# Patient Record
Sex: Female | Born: 1960 | Race: White | Hispanic: No | Marital: Married | Smoking: Never smoker
Health system: Southern US, Community
[De-identification: ages and names within clinical notes are randomized; demographics above are authoritative.]

## PROBLEM LIST (undated history)

## (undated) DIAGNOSIS — Z8739 Personal history of other diseases of the musculoskeletal system and connective tissue: Secondary | ICD-10-CM

## (undated) DIAGNOSIS — R238 Other skin changes: Secondary | ICD-10-CM

## (undated) DIAGNOSIS — N952 Postmenopausal atrophic vaginitis: Secondary | ICD-10-CM

## (undated) DIAGNOSIS — I8390 Asymptomatic varicose veins of unspecified lower extremity: Secondary | ICD-10-CM

## (undated) DIAGNOSIS — E039 Hypothyroidism, unspecified: Secondary | ICD-10-CM

## (undated) DIAGNOSIS — M1990 Unspecified osteoarthritis, unspecified site: Secondary | ICD-10-CM

## (undated) DIAGNOSIS — I10 Essential (primary) hypertension: Secondary | ICD-10-CM

## (undated) DIAGNOSIS — E049 Nontoxic goiter, unspecified: Secondary | ICD-10-CM

## (undated) DIAGNOSIS — N3001 Acute cystitis with hematuria: Secondary | ICD-10-CM

## (undated) DIAGNOSIS — F988 Other specified behavioral and emotional disorders with onset usually occurring in childhood and adolescence: Secondary | ICD-10-CM

## (undated) DIAGNOSIS — R350 Frequency of micturition: Secondary | ICD-10-CM

## (undated) DIAGNOSIS — M7989 Other specified soft tissue disorders: Secondary | ICD-10-CM

## (undated) DIAGNOSIS — K5190 Ulcerative colitis, unspecified, without complications: Secondary | ICD-10-CM

## (undated) DIAGNOSIS — Z01419 Encounter for gynecological examination (general) (routine) without abnormal findings: Secondary | ICD-10-CM

## (undated) HISTORY — DX: Other skin changes: R23.8

## (undated) HISTORY — DX: Encounter for gynecological examination (general) (routine) without abnormal findings: Z01.419

## (undated) HISTORY — DX: Other specified soft tissue disorders: M79.89

## (undated) HISTORY — DX: Hypothyroidism, unspecified: E03.9

## (undated) HISTORY — DX: Other specified behavioral and emotional disorders with onset usually occurring in childhood and adolescence: F98.8

## (undated) HISTORY — DX: Acute cystitis with hematuria: N30.01

## (undated) HISTORY — DX: Personal history of other diseases of the musculoskeletal system and connective tissue: Z87.39

## (undated) HISTORY — DX: Unspecified osteoarthritis, unspecified site: M19.90

## (undated) HISTORY — DX: Ulcerative colitis, unspecified, without complications: K51.90

## (undated) HISTORY — DX: Frequency of micturition: R35.0

## (undated) HISTORY — DX: Essential (primary) hypertension: I10

## (undated) HISTORY — DX: Nontoxic goiter, unspecified: E04.9

## (undated) HISTORY — DX: Asymptomatic varicose veins of unspecified lower extremity: I83.90

## (undated) HISTORY — DX: Postmenopausal atrophic vaginitis: N95.2

---

## 1998-06-10 ENCOUNTER — Other Ambulatory Visit: Admission: RE | Admit: 1998-06-10 | Discharge: 1998-06-10 | Payer: Self-pay | Admitting: Obstetrics and Gynecology

## 1998-12-09 ENCOUNTER — Inpatient Hospital Stay (HOSPITAL_COMMUNITY): Admission: AD | Admit: 1998-12-09 | Discharge: 1998-12-09 | Payer: Self-pay | Admitting: *Deleted

## 1998-12-15 ENCOUNTER — Other Ambulatory Visit: Admission: RE | Admit: 1998-12-15 | Discharge: 1998-12-15 | Payer: Self-pay | Admitting: Obstetrics and Gynecology

## 1999-02-10 ENCOUNTER — Encounter: Payer: Self-pay | Admitting: Obstetrics and Gynecology

## 1999-02-10 ENCOUNTER — Ambulatory Visit (HOSPITAL_COMMUNITY): Admission: RE | Admit: 1999-02-10 | Discharge: 1999-02-10 | Payer: Self-pay | Admitting: Obstetrics and Gynecology

## 1999-02-14 ENCOUNTER — Inpatient Hospital Stay (HOSPITAL_COMMUNITY): Admission: AD | Admit: 1999-02-14 | Discharge: 1999-02-14 | Payer: Self-pay | Admitting: Obstetrics and Gynecology

## 1999-06-03 ENCOUNTER — Inpatient Hospital Stay (HOSPITAL_COMMUNITY): Admission: AD | Admit: 1999-06-03 | Discharge: 1999-06-03 | Payer: Self-pay | Admitting: Obstetrics and Gynecology

## 1999-07-07 ENCOUNTER — Inpatient Hospital Stay (HOSPITAL_COMMUNITY): Admission: AD | Admit: 1999-07-07 | Discharge: 1999-07-07 | Payer: Self-pay | Admitting: Obstetrics and Gynecology

## 1999-07-12 ENCOUNTER — Inpatient Hospital Stay (HOSPITAL_COMMUNITY): Admission: AD | Admit: 1999-07-12 | Discharge: 1999-07-16 | Payer: Self-pay | Admitting: Obstetrics and Gynecology

## 1999-07-16 ENCOUNTER — Encounter: Payer: Self-pay | Admitting: Obstetrics and Gynecology

## 1999-07-17 ENCOUNTER — Encounter: Admission: RE | Admit: 1999-07-17 | Discharge: 1999-08-30 | Payer: Self-pay | Admitting: Obstetrics and Gynecology

## 1999-09-06 ENCOUNTER — Other Ambulatory Visit: Admission: RE | Admit: 1999-09-06 | Discharge: 1999-09-06 | Payer: Self-pay | Admitting: Obstetrics and Gynecology

## 2002-02-27 ENCOUNTER — Inpatient Hospital Stay (HOSPITAL_COMMUNITY): Admission: AD | Admit: 2002-02-27 | Discharge: 2002-03-04 | Payer: Self-pay | Admitting: Gastroenterology

## 2002-02-27 ENCOUNTER — Encounter: Payer: Self-pay | Admitting: Gastroenterology

## 2002-02-28 ENCOUNTER — Encounter: Payer: Self-pay | Admitting: Gastroenterology

## 2002-09-18 ENCOUNTER — Ambulatory Visit (HOSPITAL_COMMUNITY): Admission: RE | Admit: 2002-09-18 | Discharge: 2002-09-18 | Payer: Self-pay | Admitting: Gastroenterology

## 2006-06-05 HISTORY — PX: TOTAL THYROIDECTOMY: SHX2547

## 2012-07-29 ENCOUNTER — Other Ambulatory Visit: Payer: Self-pay | Admitting: *Deleted

## 2012-07-29 DIAGNOSIS — I83893 Varicose veins of bilateral lower extremities with other complications: Secondary | ICD-10-CM

## 2012-08-12 ENCOUNTER — Encounter: Payer: Self-pay | Admitting: Vascular Surgery

## 2012-08-15 ENCOUNTER — Encounter: Payer: Self-pay | Admitting: Vascular Surgery

## 2012-08-21 ENCOUNTER — Encounter: Payer: Self-pay | Admitting: Vascular Surgery

## 2012-08-22 ENCOUNTER — Encounter: Payer: Self-pay | Admitting: Vascular Surgery

## 2012-08-30 ENCOUNTER — Encounter: Payer: Self-pay | Admitting: Vascular Surgery

## 2013-09-25 DIAGNOSIS — K5190 Ulcerative colitis, unspecified, without complications: Secondary | ICD-10-CM | POA: Insufficient documentation

## 2015-05-07 ENCOUNTER — Emergency Department (HOSPITAL_COMMUNITY): Payer: BLUE CROSS/BLUE SHIELD

## 2015-05-07 ENCOUNTER — Emergency Department (HOSPITAL_COMMUNITY)
Admission: EM | Admit: 2015-05-07 | Discharge: 2015-05-08 | Disposition: A | Discharge status: Home / Self Care | Payer: BLUE CROSS/BLUE SHIELD | Attending: Emergency Medicine | Admitting: Emergency Medicine

## 2015-05-07 ENCOUNTER — Encounter (HOSPITAL_COMMUNITY): Payer: Self-pay | Admitting: *Deleted

## 2015-05-07 DIAGNOSIS — E039 Hypothyroidism, unspecified: Secondary | ICD-10-CM | POA: Diagnosis not present

## 2015-05-07 DIAGNOSIS — S20211A Contusion of right front wall of thorax, initial encounter: Secondary | ICD-10-CM | POA: Insufficient documentation

## 2015-05-07 DIAGNOSIS — W01198A Fall on same level from slipping, tripping and stumbling with subsequent striking against other object, initial encounter: Secondary | ICD-10-CM | POA: Insufficient documentation

## 2015-05-07 DIAGNOSIS — Z79899 Other long term (current) drug therapy: Secondary | ICD-10-CM | POA: Insufficient documentation

## 2015-05-07 DIAGNOSIS — S52592A Other fractures of lower end of left radius, initial encounter for closed fracture: Secondary | ICD-10-CM | POA: Diagnosis not present

## 2015-05-07 DIAGNOSIS — I10 Essential (primary) hypertension: Secondary | ICD-10-CM | POA: Diagnosis not present

## 2015-05-07 DIAGNOSIS — S6992XA Unspecified injury of left wrist, hand and finger(s), initial encounter: Secondary | ICD-10-CM | POA: Diagnosis present

## 2015-05-07 DIAGNOSIS — Y9389 Activity, other specified: Secondary | ICD-10-CM | POA: Insufficient documentation

## 2015-05-07 DIAGNOSIS — S0083XA Contusion of other part of head, initial encounter: Secondary | ICD-10-CM

## 2015-05-07 DIAGNOSIS — W19XXXA Unspecified fall, initial encounter: Secondary | ICD-10-CM

## 2015-05-07 DIAGNOSIS — Y9289 Other specified places as the place of occurrence of the external cause: Secondary | ICD-10-CM | POA: Diagnosis not present

## 2015-05-07 DIAGNOSIS — S52502A Unspecified fracture of the lower end of left radius, initial encounter for closed fracture: Secondary | ICD-10-CM

## 2015-05-07 DIAGNOSIS — Y998 Other external cause status: Secondary | ICD-10-CM | POA: Insufficient documentation

## 2015-05-07 MED ORDER — IBUPROFEN 400 MG PO TABS
600.0000 mg | ORAL_TABLET | Freq: Once | ORAL | Status: AC
  Administered 2015-05-07: 600 mg via ORAL
  Filled 2015-05-07: qty 1

## 2015-05-07 MED ORDER — HYDROCODONE-ACETAMINOPHEN 5-325 MG PO TABS: 1.0000 | ORAL_TABLET | Freq: Four times a day (QID) | ORAL | Status: AC | PRN

## 2015-05-07 MED ORDER — IBUPROFEN 600 MG PO TABS: 600.0000 mg | ORAL_TABLET | Freq: Four times a day (QID) | ORAL | Status: DC | PRN

## 2015-05-07 MED ORDER — HYDROCODONE-ACETAMINOPHEN 5-325 MG PO TABS
1.0000 | ORAL_TABLET | Freq: Once | ORAL | Status: AC
  Administered 2015-05-07: 1 via ORAL
  Filled 2015-05-07: qty 1

## 2015-05-07 NOTE — ED Notes (Signed)
EMS came to scene and placed a brace to left arm. Pt able to move fingers, good pulse and sensations.

## 2015-05-07 NOTE — Progress Notes (Signed)
Orthopedic Tech Progress Note Patient Details:  Kristina Gentry 07/14/1960 [id number]  Ortho Devices Type of Ortho Device: Ace wrap, Sugartong splint, Arm sling Ortho Device/Splint Location: LUE Ortho Device/Splint Interventions: Ordered, Application   Braulio Bosch 05/07/2015, 11:22 PM

## 2015-05-07 NOTE — ED Notes (Signed)
Pt in radiology, family showed to treatment room by RN

## 2015-05-07 NOTE — ED Notes (Signed)
Pt states that she tripped on a curb and fell and tried to catch herself and injured her left arm. Pt also hit the right side of her chest, states it is bruised and is difficult to take a deep breath. Also states that she hit her face on the curb. Denies LOC. Family states that pt is talking slower than usual.

## 2015-05-07 NOTE — Discharge Instructions (Signed)
Chest Contusion A chest contusion is a deep bruise on your chest area. Contusions are the result of an injury that caused bleeding under the skin. A chest contusion may involve bruising of the skin, muscles, or ribs. The contusion may turn blue, purple, or yellow. Minor injuries will give you a painless contusion, but more severe contusions may stay painful and swollen for a few weeks. CAUSES  A contusion is usually caused by a blow, trauma, or direct force to an area of the body. SYMPTOMS   Swelling and redness of the injured area.  Discoloration of the injured area.  Tenderness and soreness of the injured area.  Pain. DIAGNOSIS  The diagnosis can be made by taking a history and performing a physical exam. An X-ray, CT scan, or MRI may be needed to determine if there were any associated injuries, such as broken bones (fractures) or internal injuries. TREATMENT  Often, the best treatment for a chest contusion is resting, icing, and applying cold compresses to the injured area. Deep breathing exercises may be recommended to reduce the risk of pneumonia. Over-the-counter medicines may also be recommended for pain control. HOME CARE INSTRUCTIONS   Put ice on the injured area.  Put ice in a plastic bag.  Place a towel between your skin and the bag.  Leave the ice on for 15-20 minutes, 03-04 times a day.  Only take over-the-counter or prescription medicines as directed by your caregiver. Your caregiver may recommend avoiding anti-inflammatory medicines (aspirin, ibuprofen, and naproxen) for 48 hours because these medicines may increase bruising.  Rest the injured area.  Perform deep-breathing exercises as directed by your caregiver.  Stop smoking if you smoke.  Do not lift objects over 5 pounds (2.3 kg) for 3 days or longer if recommended by your caregiver. SEEK IMMEDIATE MEDICAL CARE IF:   You have increased bruising or swelling.  You have pain that is getting worse.  You have  difficulty breathing.  You have dizziness, weakness, or fainting.  You have blood in your urine or stool.  You cough up or vomit blood.  Your swelling or pain is not relieved with medicines. MAKE SURE YOU:   Understand these instructions.  Will watch your condition.  Will get help right away if you are not doing well or get worse.   This information is not intended to replace advice given to you by your health care provider. Make sure you discuss any questions you have with your health care provider.   Document Released: 02/14/2001 Document Revised: 02/14/2012 Document Reviewed: 11/13/2011 Elsevier Interactive Patient Education 2016 [employer]. Call and make appointment with DR. Gramig to be seen next week

## 2015-05-07 NOTE — ED Notes (Signed)
Spoke with Dr Zenia Resides regarding pt, orders received.

## 2015-05-07 NOTE — ED Provider Notes (Signed)
CSN: [id number]     Arrival date & time 05/07/15  2123 History   First MD Initiated Contact with Patient 05/07/15 2229     Chief Complaint  Patient presents with  . Fall  . Arm Injury     (Consider location/radiation/quality/duration/timing/severity/associated sxs/prior Treatment) HPI Comments: Is a 8 year 54-year-old female who was rushing in the dark and tripped over curb.  She caught herself with her outstretched left hand.  She also struck her chin on the ground forcing her head backwards and hitting her right lateral rib on the edge of curb.  She reports swelling to her left wrist, right facial pain and right rib pain.  She arrived from the scene.  EMS transported immobilized her left wrist.  She was not given any medication for discomfort.  Prior to arrival.  Patient is a 54 y.o. female presenting with fall and arm injury. The history is provided by the patient.  Fall This is a new problem. The current episode started today. The problem occurs constantly. The problem has been unchanged. Associated symptoms include chest pain and joint swelling. Pertinent negatives include no abdominal pain, coughing, fever, headaches or numbness. The symptoms are aggravated by twisting. She has tried immobilization for the symptoms. The treatment provided no relief.  Arm Injury Associated symptoms: no fever     Past Medical History  Diagnosis Date  . Varicose veins   . Leg swelling   . Hypertension   . Hypothyroidism   . H/O degenerative disc disease   . Degenerative joint disease   . ADD (attention deficit disorder)   . Ulcerative colitis ([place])   . Goiter    Past Surgical History  Procedure Laterality Date  . Total thyroidectomy  2008  . Cesarean section  3419,3790   No family history on file. Social History  Substance Use Topics  . Smoking status: Never Smoker   . Smokeless tobacco: None  . Alcohol Use: Yes     Comment: rare   OB History    No data available     Review of  Systems  Constitutional: Negative for fever.  Eyes: Negative for visual disturbance.  Respiratory: Negative for cough and shortness of breath.   Cardiovascular: Positive for chest pain.  Gastrointestinal: Negative for abdominal pain.  Musculoskeletal: Positive for joint swelling.  Skin: Negative for wound.  Neurological: Negative for dizziness, numbness and headaches.  All other systems reviewed and are negative.     Allergies  Review of patient's allergies indicates no known allergies.  Home Medications   Prior to Admission medications   Medication Sig Start Date End Date Taking? Authorizing Provider  adalimumab (HUMIRA PEN) 40 MG/0.8ML injection Inject 40 mg into the skin once a week. Take around tuesdays or wednesdays per patient   Yes Historical Provider, MD  ALPRAZolam Duanne Moron) 0.5 MG tablet Take 0.5 mg by mouth 2 (two) times daily as needed for sleep.   Yes Historical Provider, MD  buPROPion (WELLBUTRIN XL) 150 MG 24 hr tablet Take 150 mg by mouth daily.   Yes Historical Provider, MD  cyclobenzaprine (FLEXERIL) 10 MG tablet Take 10 mg by mouth 3 (three) times daily as needed for muscle spasms.   Yes Historical Provider, MD  levothyroxine (SYNTHROID, LEVOTHROID) 175 MCG tablet Take 175 mcg by mouth daily.   Yes Historical Provider, MD  lisdexamfetamine (VYVANSE) 70 MG capsule Take 70 mg by mouth daily.   Yes Historical Provider, MD  lisinopril (PRINIVIL,ZESTRIL) 10 MG tablet Take 10 mg  by mouth daily.   Yes Historical Provider, MD  Multiple Vitamin (MULTIVITAMIN) tablet Take 1 tablet by mouth daily.   Yes Historical Provider, MD  HYDROcodone-acetaminophen (NORCO/VICODIN) 5-325 MG tablet Take 1 tablet by mouth every 6 (six) hours as needed for moderate pain. 05/07/15   Junius Creamer, NP  ibuprofen (ADVIL,MOTRIN) 600 MG tablet Take 1 tablet (600 mg total) by mouth every 6 (six) hours as needed. 05/07/15   Junius Creamer, NP   BP 164/99 mmHg  Pulse 78  Temp(Src) 97.8 F (36.6 C) (Oral)   Resp 22  Ht 5' 6"  (1.676 m)  Wt 98.431 kg  BMI 35.04 kg/m2  SpO2 100% Physical Exam  Constitutional: She is oriented to person, place, and time. She appears well-developed and well-nourished.  HENT:  Head: Normocephalic.  Mouth/Throat: Oropharynx is clear and moist.  Eyes: Pupils are equal, round, and reactive to light.  Neck: Normal range of motion. No spinous process tenderness and no muscular tenderness present.  Cardiovascular: Normal rate and regular rhythm.   Pulmonary/Chest: Effort normal and breath sounds normal. She exhibits tenderness.    Musculoskeletal: She exhibits tenderness. She exhibits no edema.       Left wrist: She exhibits decreased range of motion, tenderness and deformity.       Arms: Neurological: She is alert and oriented to person, place, and time.  Skin: Skin is warm and dry.  Nursing note and vitals reviewed.   ED Course  Procedures (including critical care time) Labs Review Labs Reviewed - No data to display  Imaging Review Dg Ribs Unilateral W/chest Right  05/07/2015  CLINICAL DATA:  54 year old female with fall and right sided chest/rib pain EXAM: RIGHT RIBS AND CHEST - 3+ VIEW COMPARISON:  None. FINDINGS: No fracture or other bone lesions are seen involving the ribs. There is no evidence of pneumothorax or pleural effusion. Both lungs are clear. Heart size and mediastinal contours are within normal limits. IMPRESSION: No rib fracture.  No pneumothorax. Electronically Signed   By: Anner Crete M.D.   On: 05/07/2015 22:29   Dg Wrist Complete Left  05/07/2015  CLINICAL DATA:  Left wrist pain after fall EXAM: LEFT WRIST - COMPLETE 3+ VIEW COMPARISON:  None. FINDINGS: Nondisplaced intra-articular dorsal left distal radius fracture. No additional fracture. No suspicious focal osseous lesion. No dislocation. No appreciable arthropathy. IMPRESSION: Nondisplaced intra-articular dorsal left distal radius fracture. Electronically Signed   By: Ilona Sorrel  M.D.   On: 05/07/2015 22:29   Ct Head Wo Contrast  05/07/2015  CLINICAL DATA:  [place] today, striking the right side of her head. EXAM: CT HEAD WITHOUT CONTRAST TECHNIQUE: Contiguous axial images were obtained from the base of the skull through the vertex without intravenous contrast. COMPARISON:  01/25/2007 FINDINGS: There is no intracranial hemorrhage, mass or evidence of acute infarction. There is no extra-axial fluid collection. Gray matter and white matter appear normal. Cerebral volume is normal for age. Brainstem and posterior fossa are unremarkable. The CSF spaces appear normal. The bony structures are intact. The visible portions of the paranasal sinuses are clear. IMPRESSION: Normal brain Electronically Signed   By: Andreas Newport M.D.   On: 05/07/2015 22:39   I have personally reviewed and evaluated these images and lab results as part of my medical decision-making.   EKG Interpretation None    L arm splint placed to FU with Dr. Amedeo Plenty next week NO sign of head injury on CT Scan  No rib fractures   MDM  Final diagnoses:  Fall, initial encounter  Radius distal fracture, left, closed, initial encounter  Facial contusion, initial encounter  Chest wall contusion, right, initial encounter         Junius Creamer, NP 05/07/15 [place], NP 05/07/15 [id number]  Lacretia Leigh, MD 05/08/15 2344

## 2016-03-27 DIAGNOSIS — Z13820 Encounter for screening for osteoporosis: Secondary | ICD-10-CM | POA: Diagnosis not present

## 2016-03-27 DIAGNOSIS — Z1231 Encounter for screening mammogram for malignant neoplasm of breast: Secondary | ICD-10-CM | POA: Diagnosis not present

## 2016-03-27 DIAGNOSIS — M8588 Other specified disorders of bone density and structure, other site: Secondary | ICD-10-CM | POA: Diagnosis not present

## 2016-04-03 DIAGNOSIS — M7672 Peroneal tendinitis, left leg: Secondary | ICD-10-CM | POA: Diagnosis not present

## 2016-04-03 DIAGNOSIS — M79672 Pain in left foot: Secondary | ICD-10-CM | POA: Diagnosis not present

## 2016-04-22 DIAGNOSIS — M5136 Other intervertebral disc degeneration, lumbar region: Secondary | ICD-10-CM | POA: Diagnosis not present

## 2016-04-25 DIAGNOSIS — G8929 Other chronic pain: Secondary | ICD-10-CM | POA: Diagnosis not present

## 2016-04-25 DIAGNOSIS — M25572 Pain in left ankle and joints of left foot: Secondary | ICD-10-CM | POA: Diagnosis not present

## 2016-05-05 DIAGNOSIS — M5136 Other intervertebral disc degeneration, lumbar region: Secondary | ICD-10-CM | POA: Diagnosis not present

## 2016-05-09 DIAGNOSIS — R350 Frequency of micturition: Secondary | ICD-10-CM

## 2016-05-09 DIAGNOSIS — K5190 Ulcerative colitis, unspecified, without complications: Secondary | ICD-10-CM | POA: Diagnosis not present

## 2016-05-09 DIAGNOSIS — R238 Other skin changes: Secondary | ICD-10-CM | POA: Diagnosis not present

## 2016-05-09 DIAGNOSIS — N3001 Acute cystitis with hematuria: Secondary | ICD-10-CM | POA: Insufficient documentation

## 2016-05-09 DIAGNOSIS — N952 Postmenopausal atrophic vaginitis: Secondary | ICD-10-CM

## 2016-05-09 DIAGNOSIS — Z01419 Encounter for gynecological examination (general) (routine) without abnormal findings: Secondary | ICD-10-CM | POA: Insufficient documentation

## 2016-05-09 DIAGNOSIS — R233 Spontaneous ecchymoses: Secondary | ICD-10-CM

## 2016-05-09 HISTORY — DX: Spontaneous ecchymoses: R23.3

## 2016-05-09 HISTORY — DX: Frequency of micturition: R35.0

## 2016-05-09 HISTORY — DX: Other skin changes: R23.8

## 2016-05-09 HISTORY — DX: Postmenopausal atrophic vaginitis: N95.2

## 2016-05-09 HISTORY — DX: Acute cystitis with hematuria: N30.01

## 2016-05-09 HISTORY — DX: Encounter for gynecological examination (general) (routine) without abnormal findings: Z01.419

## 2016-06-04 DIAGNOSIS — K5190 Ulcerative colitis, unspecified, without complications: Secondary | ICD-10-CM | POA: Diagnosis not present

## 2016-06-04 DIAGNOSIS — R6889 Other general symptoms and signs: Secondary | ICD-10-CM | POA: Diagnosis not present

## 2016-10-11 DIAGNOSIS — E559 Vitamin D deficiency, unspecified: Secondary | ICD-10-CM | POA: Diagnosis not present

## 2016-10-11 DIAGNOSIS — I10 Essential (primary) hypertension: Secondary | ICD-10-CM | POA: Diagnosis not present

## 2016-10-11 DIAGNOSIS — E890 Postprocedural hypothyroidism: Secondary | ICD-10-CM | POA: Diagnosis not present

## 2016-10-11 DIAGNOSIS — R41840 Attention and concentration deficit: Secondary | ICD-10-CM | POA: Diagnosis not present

## 2017-03-07 DIAGNOSIS — R9431 Abnormal electrocardiogram [ECG] [EKG]: Secondary | ICD-10-CM | POA: Diagnosis not present

## 2017-03-07 DIAGNOSIS — I10 Essential (primary) hypertension: Secondary | ICD-10-CM | POA: Diagnosis not present

## 2017-03-07 DIAGNOSIS — R079 Chest pain, unspecified: Secondary | ICD-10-CM | POA: Diagnosis not present

## 2017-03-07 DIAGNOSIS — R000 Tachycardia, unspecified: Secondary | ICD-10-CM | POA: Diagnosis not present

## 2017-03-13 DIAGNOSIS — I10 Essential (primary) hypertension: Secondary | ICD-10-CM | POA: Insufficient documentation

## 2017-03-13 DIAGNOSIS — Z8739 Personal history of other diseases of the musculoskeletal system and connective tissue: Secondary | ICD-10-CM | POA: Insufficient documentation

## 2017-03-13 DIAGNOSIS — E049 Nontoxic goiter, unspecified: Secondary | ICD-10-CM | POA: Insufficient documentation

## 2017-03-13 DIAGNOSIS — R079 Chest pain, unspecified: Secondary | ICD-10-CM | POA: Insufficient documentation

## 2017-03-13 DIAGNOSIS — M7989 Other specified soft tissue disorders: Secondary | ICD-10-CM | POA: Insufficient documentation

## 2017-03-13 DIAGNOSIS — F988 Other specified behavioral and emotional disorders with onset usually occurring in childhood and adolescence: Secondary | ICD-10-CM | POA: Insufficient documentation

## 2017-03-13 DIAGNOSIS — E039 Hypothyroidism, unspecified: Secondary | ICD-10-CM | POA: Insufficient documentation

## 2017-03-13 DIAGNOSIS — M1990 Unspecified osteoarthritis, unspecified site: Secondary | ICD-10-CM | POA: Insufficient documentation

## 2017-03-13 HISTORY — DX: Chest pain, unspecified: R07.9

## 2017-03-13 NOTE — Progress Notes (Signed)
Cardiology Office Note:    Date:  03/15/2017   ID:  Kristina Gentry, DOB 1960/06/28, MRN [id number]  PCP:  Ernestene Kiel, MD  Cardiologist:  Shirlee More, MD   Referring MD: Ernestene Kiel, MD  ASSESSMENT:    1. Chest pain in adult   2. Chronic venous insufficiency    PLAN:    In order of problems listed above:  1. Idiopathic pleuritis resolved at this time I perform no further cardiovascular evaluation. In particular she had no subclinical atherosclerosis, coronary artery calcification on CT scan and has no indication of ischemic heart disease. 2. I advised her to continue to use a moisturizer with dry skin from stasis dermatitis support hose and leg elevation.   Next appointment as needed   Medication Adjustments/Labs and Tests Ordered: Current medicines are reviewed at length with the patient today.  Concerns regarding medicines are outlined above.  Orders Placed This Encounter  Procedures  . EKG 12-Lead   No orders of the defined types were placed in this encounter.    Chief Complaint  Patient presents with  . New Patient (Initial Visit)    per ER to discuss CP   . Chest Pain    History of Present Illness:    Kristina Gentry is a 56 y.o. female who is being seen today for the evaluation of pleuritic chest pain after ED visit 03/07/17. at the request of Ernestene Kiel, MD. From ED note-Patient reports the chest pain has been constant since onset and hurts worse when inhaling. Pateint notes the pain radiates into her left shoulder. Her chest pain has resolved and she has no exertional shortness of breath chest discomfort palpitation syncope or TIA. She has no history of rheumatologic disease congenital or rheumatic heart disease. Her CT scan showed calcified lymph nodes she's had previous normal skin test for tuberculosis prior to biologic agents for her ulcerative colitis. She is having no cough or hemoptysis. Past Medical History:  Diagnosis Date    . Acute cystitis with hematuria 05/09/2016  . ADD (attention deficit disorder)   . Atrophic vaginitis 05/09/2016  . Degenerative joint disease   . Easy bruisability 05/09/2016  . Goiter   . H/O degenerative disc disease   . Hypertension   . Hypothyroidism   . Increased frequency of urination 05/09/2016  . Leg swelling   . Ulcerative colitis ([place])   . Varicose veins   . Women's annual routine gynecological examination 05/09/2016    Past Surgical History:  Procedure Laterality Date  . CESAREAN SECTION  [id number]  . TOTAL THYROIDECTOMY  2008    Current Medications: Current Meds  Medication Sig  . ALPRAZolam (XANAX) 0.5 MG tablet Take 0.5 mg by mouth 2 (two) times daily as needed for sleep.  . Ascorbic Acid (C-500) 500 MG CHEW Chew 500 mg by mouth daily.  . B Complex Vitamins (VITAMIN B COMPLEX PO) Take 1 capsule by mouth daily.  Marland Kitchen BIOTIN PO Take 1 tablet by mouth daily.  Marland Kitchen buPROPion (WELLBUTRIN XL) 300 MG 24 hr tablet Take 300 mg by mouth daily.   . Calcium Citrate-Vitamin D (CALCIUM + D PO) Take 1 tablet by mouth 2 (two) times daily.  . Coenzyme Q10 (CO Q10 PO) Take 1 tablet by mouth daily.  . cyclobenzaprine (FLEXERIL) 10 MG tablet Take 10 mg by mouth 3 (three) times daily as needed for muscle spasms.  Marland Kitchen GLUCOSAMINE-CHONDROITIN PO Take 1 tablet by mouth daily.  Marland Kitchen HYDROcodone-acetaminophen (NORCO/VICODIN) 5-325 MG tablet Take  1 tablet by mouth every 6 (six) hours as needed for moderate pain.  Marland Kitchen ibuprofen (ADVIL,MOTRIN) 600 MG tablet Take 1 tablet (600 mg total) by mouth every 6 (six) hours as needed.  Marland Kitchen levothyroxine (SYNTHROID, LEVOTHROID) 175 MCG tablet Take 175 mcg by mouth daily.  Marland Kitchen lisdexamfetamine (VYVANSE) 70 MG capsule Take 70 mg by mouth daily.  Marland Kitchen lisinopril (PRINIVIL,ZESTRIL) 10 MG tablet Take 10 mg by mouth daily.  . Multiple Vitamin (MULTIVITAMIN) tablet Take 1 tablet by mouth daily.  . TURMERIC PO Take 1 tablet by mouth daily.     Allergies:   Patient has no known  allergies.   Social History   Social History  . Marital status: Married    Spouse name: N/A  . Number of children: N/A  . Years of education: N/A   Social History Main Topics  . Smoking status: Never Smoker  . Smokeless tobacco: Never Used  . Alcohol use Yes     Comment: rare  . Drug use: No  . Sexual activity: Not Asked   Other Topics Concern  . None   Social History Narrative  . None     Family History: The patient's family history includes Breast cancer in her maternal grandmother; COPD in her mother; Emphysema in her mother; Heart Problems in her maternal grandfather; Testicular cancer in her brother.  ROS:   ROS Please see the history of present illness.    Complains of discomfort and swelling in the lower extremities All other systems reviewed and are negative.  EKGs/Labs/Other Studies Reviewed:    The following studies were reviewed today: I reviewed emergency room records rior to the office visit independently reviewd her EKG labs and x-rays p EKG:  EKG - Performed by Nursing EKG - Performed by Nursing  Narrative Performed At  Ventricular Rate 107 BPM   Atrial Rate107 BPM   P-R Interval 164 ms  QRS Duration 65m  Q-T Interval 350 ms  QTC467 ms  P Axis 71degrees   R Axis 91degrees   T Axis 68degrees     Sinus tachycardia    END    Rightward axis    END    Nonspecific ST abnormality    END    Abnormal ECG    END    No previous ECGs available    END    Confirmed by MMathis [phone] on  03/07/2017 6:11:17 PM            Chest CTA: FINDINGS: Cardiovascular: Satisfactory opacification of the pulmonary arteries to the segmental level. No evidence of pulmonary embolism. Normal heart size. No pericardial effusion. Normal thoracic aorta. Mediastinum/Nodes: No enlarged mediastinal, hilar, or axillary lymph nodes. Calcified right hilar lymph nodes. Thyroid gland, trachea, and esophagus demonstrate no significant findings. Lungs/Pleura: No suspicious pulmonary nodule. No consolidation, pleural effusion, or pneumothorax. Clustered calcified granulomas in the right lower lobe, unchanged. Upper Abdomen: No acute abnormality. Musculoskeletal: No chest wall abnormality. No acute or significant osseous findings. Unchanged hemangiomas in T11 and T12 vertebral bodies. Review of the MIP images confirms the above findings. IMPRESSION: 1. No evidence of pulmonary embolism. 2. No acute intrathoracic process.   Recent Labs: CMP Troponin CBC all normal No results found for requested labs within last 8760 hours.  Recent Lipid Panel No results found for: CHOL, TRIG, HDL, CHOLHDL, VLDL, LDLCALC, LDLDIRECT  Physical Exam:    VS:  BP (!) 132/96 (BP Location: Right Arm, Patient Position: Sitting)   Pulse 83   Ht  5' 7"  (1.702 m)   Wt 214 lb 12.8 oz (97.4 kg)   SpO2 99%   BMI 33.64 kg/m     Wt Readings from Last 3 Encounters:  03/14/17 214 lb 12.8 oz (97.4 kg)  05/07/15 217 lb (98.4 kg)     GEN:  Well nourished, well developed in no acute distress HEENT: Normal NECK: No JVD; No carotid bruits LYMPHATICS: No lymphadenopathy CARDIAC: RRR, no murmurs, rubs, gallops RESPIRATORY:  Clear to auscultation without rales, wheezing or rhonchi  ABDOMEN: Soft, non-tender, non-distended MUSCULOSKELETAL:  No edema; No deformity , varicose veins and mild dependent edema and stasis changes at the ankle SKIN: Warm and dry NEUROLOGIC:  Alert and oriented x 3 PSYCHIATRIC:  Normal affect      Signed, Shirlee More, MD  03/15/2017 8:12 AM    [place]

## 2017-03-14 ENCOUNTER — Encounter: Payer: Self-pay | Admitting: Cardiology

## 2017-03-14 ENCOUNTER — Ambulatory Visit: Payer: BLUE CROSS/BLUE SHIELD | Admitting: Cardiology

## 2017-03-14 ENCOUNTER — Ambulatory Visit (INDEPENDENT_AMBULATORY_CARE_PROVIDER_SITE_OTHER): Payer: BLUE CROSS/BLUE SHIELD | Admitting: Cardiology

## 2017-03-14 DIAGNOSIS — I872 Venous insufficiency (chronic) (peripheral): Secondary | ICD-10-CM | POA: Diagnosis not present

## 2017-03-14 DIAGNOSIS — R079 Chest pain, unspecified: Secondary | ICD-10-CM

## 2017-03-14 HISTORY — DX: Venous insufficiency (chronic) (peripheral): I87.2

## 2017-03-14 NOTE — Patient Instructions (Signed)
Locations to Purchase Compression Stockings:  [facility] [address]. [place], [place] [address] [phone] (fax) [email or web address] *Mon-Fri 9am-4:30pm* *Closed on Holidays*  Putting on Compression Stockings Turn the stocking inside-out, then fit it over your toes and heel.  Roll the stocking up your leg.  Once stockings are on, make sure the top of the stocking is about two fingers' width below the crease of the knee (or the groin if you wear thigh-high stockings).  Use equipment, such as a stocking donner, or wear rubber gloves to make it easier to put on compression stockings.   Elastic compression stockings are prescribed to treat many vein problems. Wearing them may be the most important thing you do to manage your symptoms. The stockings fit tightly aroundyour ankle, gradually reducing in pressure as they go up your legs. This helps keep blood flowing toyour heart. As a result, swelling is reduced. Your doctor will prescribe stockings at a safe pressure for you. He or she will also tell you how often to wear and remove the stockings. Follow these instructions closely.Also, do not buy or wear compression stockings without first seeing your doctor. Tips for Wear and Care To wear stockings safely and to get the most benefit:  Wear the length prescribed by your doctor.  Pull them to the designated height and no farther. Don't let them bunch at the top. This can restrict blood flow and increase swelling.  Wear the stockingsfor the amount of time your doctor recommends. Replace them when they start to feel loose. This will likely be every 3 to 6 months.  Remove them as your doctor directs. When removed, wash your legs. Then check your legs and feet for sores. Call your doctor if you find a sore. Don't put the stockings back on unless your doctor directs.  Wash the stockings as instructed. They may need to be handwashed.

## 2017-06-08 DIAGNOSIS — R41840 Attention and concentration deficit: Secondary | ICD-10-CM | POA: Diagnosis not present

## 2017-06-08 DIAGNOSIS — I10 Essential (primary) hypertension: Secondary | ICD-10-CM | POA: Diagnosis not present

## 2017-06-08 DIAGNOSIS — M0760 Enteropathic arthropathies, unspecified site: Secondary | ICD-10-CM | POA: Diagnosis not present

## 2017-06-08 DIAGNOSIS — E890 Postprocedural hypothyroidism: Secondary | ICD-10-CM | POA: Diagnosis not present

## 2017-09-07 DIAGNOSIS — R41840 Attention and concentration deficit: Secondary | ICD-10-CM | POA: Diagnosis not present

## 2017-09-07 DIAGNOSIS — K51918 Ulcerative colitis, unspecified with other complication: Secondary | ICD-10-CM | POA: Diagnosis not present

## 2017-09-07 DIAGNOSIS — E890 Postprocedural hypothyroidism: Secondary | ICD-10-CM | POA: Diagnosis not present

## 2017-09-07 DIAGNOSIS — Z79899 Other long term (current) drug therapy: Secondary | ICD-10-CM | POA: Diagnosis not present

## 2017-09-07 DIAGNOSIS — I10 Essential (primary) hypertension: Secondary | ICD-10-CM | POA: Diagnosis not present

## 2017-12-14 DIAGNOSIS — R41840 Attention and concentration deficit: Secondary | ICD-10-CM | POA: Diagnosis not present

## 2017-12-14 DIAGNOSIS — K51918 Ulcerative colitis, unspecified with other complication: Secondary | ICD-10-CM | POA: Diagnosis not present

## 2017-12-14 DIAGNOSIS — E890 Postprocedural hypothyroidism: Secondary | ICD-10-CM | POA: Diagnosis not present

## 2018-03-20 DIAGNOSIS — J209 Acute bronchitis, unspecified: Secondary | ICD-10-CM | POA: Diagnosis not present

## 2018-03-20 DIAGNOSIS — J0100 Acute maxillary sinusitis, unspecified: Secondary | ICD-10-CM | POA: Diagnosis not present

## 2018-03-29 DIAGNOSIS — R05 Cough: Secondary | ICD-10-CM | POA: Diagnosis not present

## 2018-03-29 DIAGNOSIS — R41840 Attention and concentration deficit: Secondary | ICD-10-CM | POA: Diagnosis not present

## 2018-04-15 ENCOUNTER — Ambulatory Visit
Admission: RE | Admit: 2018-04-15 | Discharge: 2018-04-15 | Disposition: A | Discharge status: Home / Self Care | Payer: BLUE CROSS/BLUE SHIELD | Source: Ambulatory Visit | Attending: Obstetrics & Gynecology | Admitting: Obstetrics & Gynecology

## 2018-04-15 ENCOUNTER — Other Ambulatory Visit: Payer: Self-pay | Admitting: Obstetrics & Gynecology

## 2018-04-15 DIAGNOSIS — R05 Cough: Secondary | ICD-10-CM | POA: Diagnosis not present

## 2018-04-15 DIAGNOSIS — Z1231 Encounter for screening mammogram for malignant neoplasm of breast: Secondary | ICD-10-CM

## 2018-05-06 DIAGNOSIS — M8589 Other specified disorders of bone density and structure, multiple sites: Secondary | ICD-10-CM | POA: Insufficient documentation

## 2018-05-06 DIAGNOSIS — M8580 Other specified disorders of bone density and structure, unspecified site: Secondary | ICD-10-CM | POA: Diagnosis not present

## 2018-05-06 DIAGNOSIS — N952 Postmenopausal atrophic vaginitis: Secondary | ICD-10-CM | POA: Diagnosis not present

## 2018-05-06 DIAGNOSIS — Z01419 Encounter for gynecological examination (general) (routine) without abnormal findings: Secondary | ICD-10-CM | POA: Diagnosis not present

## 2018-11-14 DIAGNOSIS — Z79899 Other long term (current) drug therapy: Secondary | ICD-10-CM | POA: Diagnosis not present

## 2018-11-14 DIAGNOSIS — E890 Postprocedural hypothyroidism: Secondary | ICD-10-CM | POA: Diagnosis not present

## 2019-04-28 ENCOUNTER — Other Ambulatory Visit: Payer: Self-pay | Admitting: Obstetrics & Gynecology

## 2019-04-28 DIAGNOSIS — Z1231 Encounter for screening mammogram for malignant neoplasm of breast: Secondary | ICD-10-CM

## 2019-04-29 ENCOUNTER — Other Ambulatory Visit: Payer: Self-pay

## 2019-04-29 ENCOUNTER — Ambulatory Visit: Payer: BLUE CROSS/BLUE SHIELD

## 2019-05-09 DIAGNOSIS — Z1231 Encounter for screening mammogram for malignant neoplasm of breast: Secondary | ICD-10-CM | POA: Diagnosis not present

## 2019-05-14 DIAGNOSIS — K51011 Ulcerative (chronic) pancolitis with rectal bleeding: Secondary | ICD-10-CM | POA: Diagnosis not present

## 2019-05-16 DIAGNOSIS — N952 Postmenopausal atrophic vaginitis: Secondary | ICD-10-CM | POA: Diagnosis not present

## 2019-05-16 DIAGNOSIS — Z01419 Encounter for gynecological examination (general) (routine) without abnormal findings: Secondary | ICD-10-CM | POA: Diagnosis not present

## 2019-05-21 DIAGNOSIS — M8580 Other specified disorders of bone density and structure, unspecified site: Secondary | ICD-10-CM | POA: Diagnosis not present

## 2019-05-21 DIAGNOSIS — M8588 Other specified disorders of bone density and structure, other site: Secondary | ICD-10-CM | POA: Diagnosis not present

## 2019-05-21 DIAGNOSIS — Z780 Asymptomatic menopausal state: Secondary | ICD-10-CM | POA: Diagnosis not present

## 2019-06-05 DIAGNOSIS — E890 Postprocedural hypothyroidism: Secondary | ICD-10-CM | POA: Diagnosis not present

## 2019-06-05 DIAGNOSIS — I10 Essential (primary) hypertension: Secondary | ICD-10-CM | POA: Diagnosis not present

## 2019-07-23 ENCOUNTER — Telehealth: Payer: Self-pay | Admitting: Internal Medicine

## 2019-07-23 NOTE — Telephone Encounter (Signed)
Dr. Carlean Purl, pt stated that her insurance has changed and previous GI MD is no longer in network.  Pt was researching MDs and requested you.  Her records are in [facility] for review.    Pt reported that she is experiencing symptoms related to colitis.  Will you accept this pt?

## 2019-07-23 NOTE — Telephone Encounter (Signed)
Appreciate her asking but I think Dr. Bryan Lemma would be an excellent choice for her if she is willing to see him

## 2019-07-25 ENCOUNTER — Encounter: Payer: Self-pay | Admitting: Gastroenterology

## 2019-07-25 NOTE — Telephone Encounter (Signed)
No problem, happy to see her in [facility] if she is acceptable to this plan.  Thank you.

## 2019-07-31 IMAGING — MG DIGITAL SCREENING BILATERAL MAMMOGRAM WITH TOMO AND CAD
6 of 10 series · 6 of 30 positions shown · non-contrast
Comparison: Previous exam(s).

CLINICAL DATA: Screening.

EXAM:
DIGITAL SCREENING BILATERAL MAMMOGRAM WITH TOMO AND CAD

[R MLO synth-2D (1 of 2)]
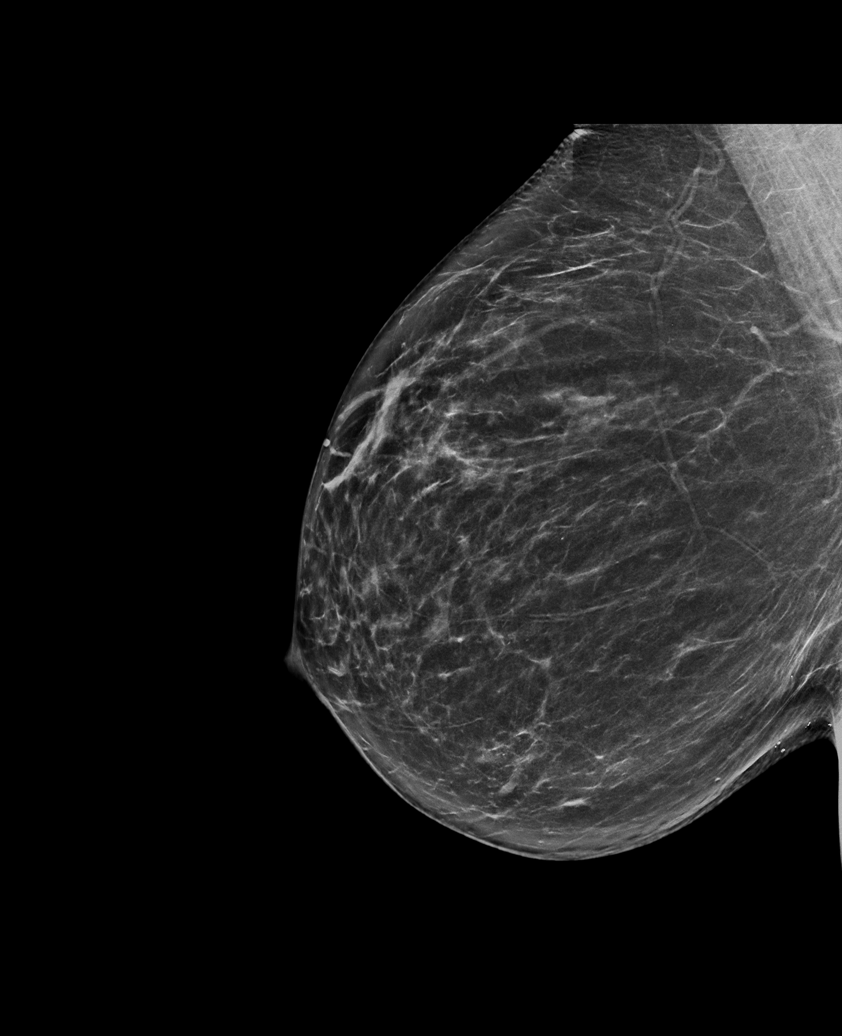

[L CC synth-2D]
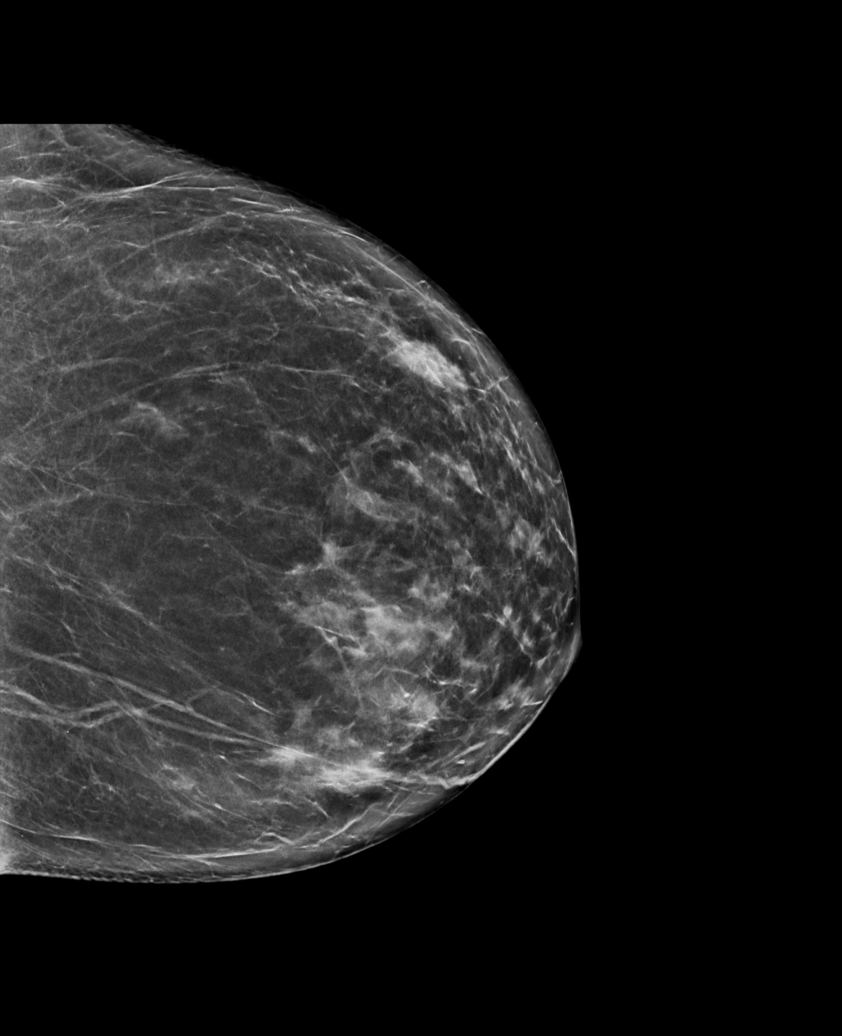

[R MLO synth-2D (2 of 2)]
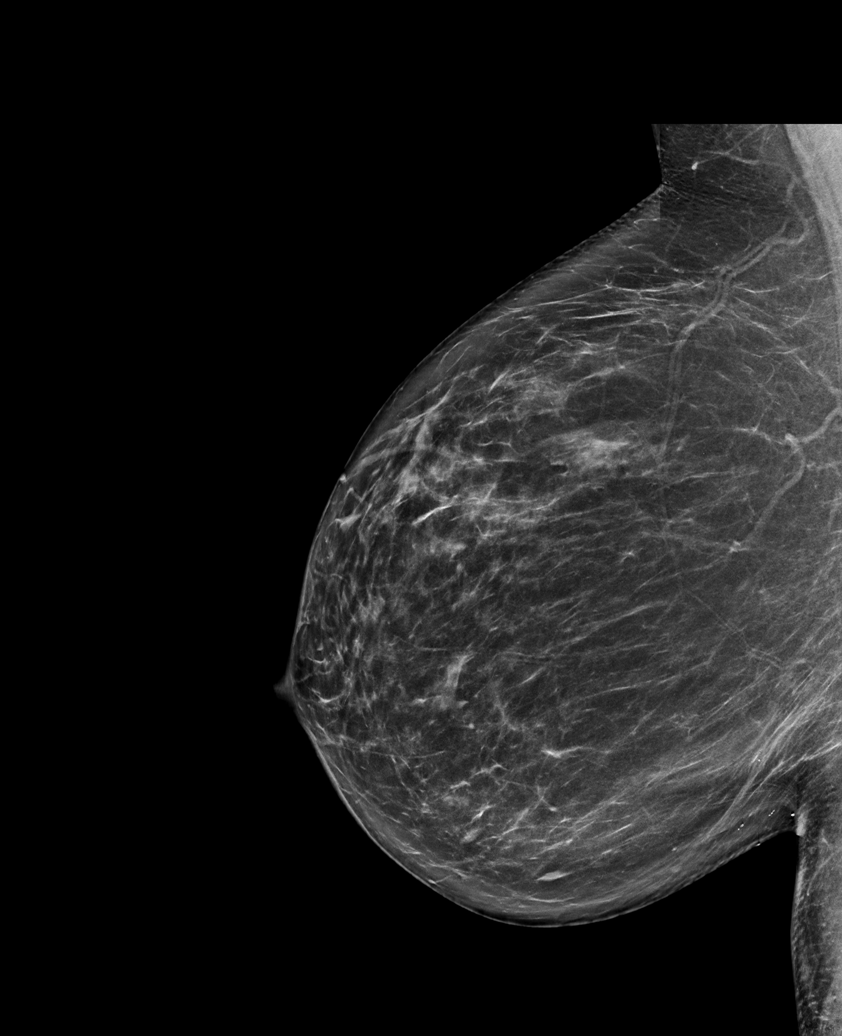

[L MLO synth-2D]
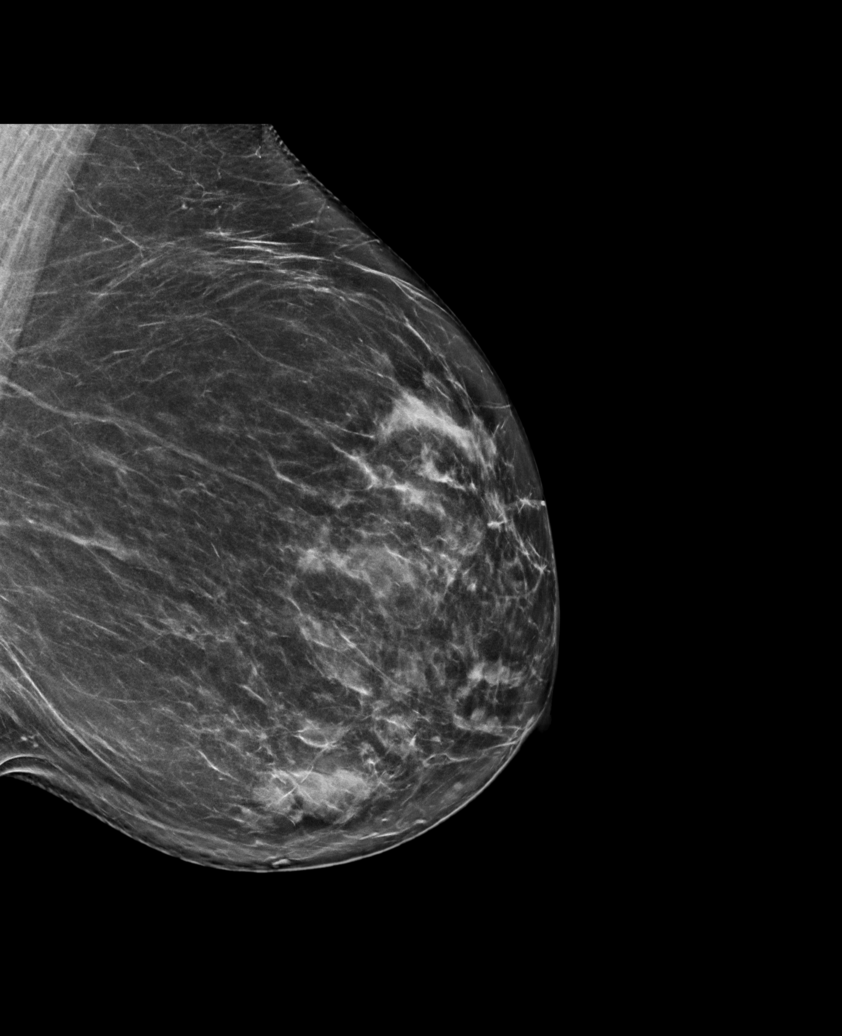

[R CC synth-2D]
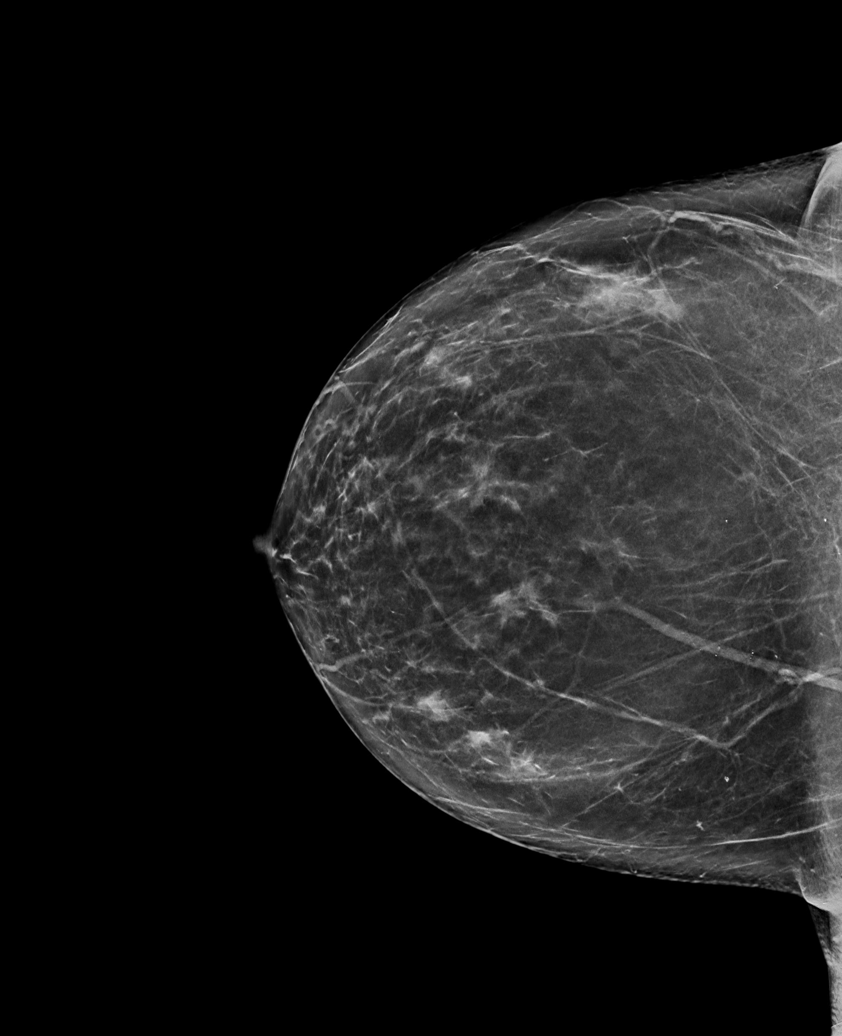

[R MLO tomo · tomo slice 39/78.0]
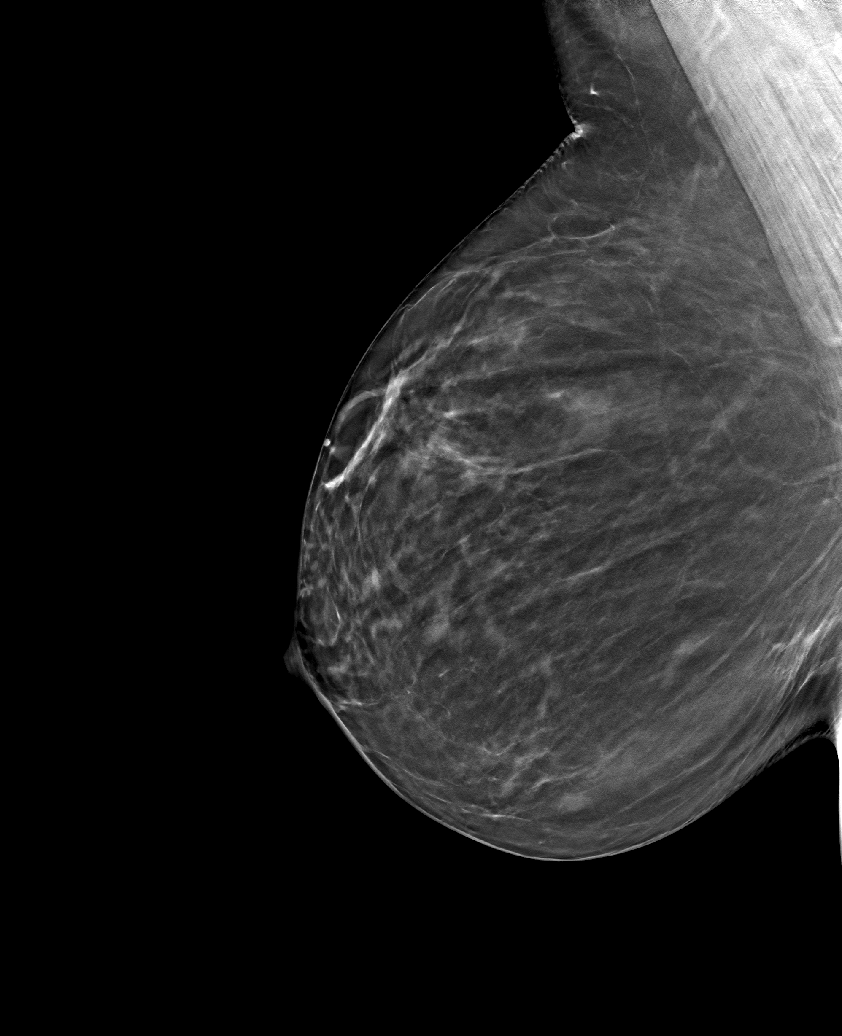

[6 of 30 positions shown; findings below may reference images not displayed]

ACR Breast Density Category c: The breast tissue is heterogeneously
dense, which may obscure small masses.
FINDINGS: There are no findings suspicious for malignancy. Images were
processed with CAD.
IMPRESSION: No mammographic evidence of malignancy. A result letter of this
screening mammogram will be mailed directly to the patient.

RECOMMENDATION:
Screening mammogram in one year. (Code:[id number])

BI-RADS CATEGORY  1: Negative.

## 2019-08-01 ENCOUNTER — Telehealth: Payer: Self-pay | Admitting: Gastroenterology

## 2019-08-01 NOTE — Telephone Encounter (Signed)
I have not seen this patient. Her Humira was prescribed by GI at [facility].   Generally speaking, Covid-19 vaccine is recommended in those who are immunosuppressed, so would typically recommend obtaining the vaccine as planned. For her Humira, difficult to make a recommendation since we have never met nor did I prescribe the drug. I tend to do labs prior to re-initiation of a biologic agent that a patient has been previously exposed to (ie, adalimumab antibody panel).

## 2019-08-01 NOTE — Telephone Encounter (Signed)
Pt reported that she is getting her first covid vaccine today and that she is to start Humira.  She would like to discuss whether to postpone covid vaccine or postpone starting Humira.

## 2019-08-01 NOTE — Telephone Encounter (Signed)
Please review previous message and advise

## 2019-08-01 NOTE — Telephone Encounter (Signed)
Attempted to reach patient-unable to leave a VM as phone "rings"; will attempt to reach patient at a later date/time;

## 2019-08-04 NOTE — Telephone Encounter (Signed)
Attempted to reach patient-phone just "rings" -unable to leave a VM-will attempt to reach patient at a later date/time;

## 2019-08-05 NOTE — Telephone Encounter (Signed)
Attempted to reach patient-phone just "rings" -unable to leave a VM-will attempt to reach patient at a later date/time;

## 2019-08-06 ENCOUNTER — Ambulatory Visit: Payer: No Typology Code available for payment source | Admitting: Gastroenterology

## 2019-08-06 ENCOUNTER — Encounter: Payer: Self-pay | Admitting: Gastroenterology

## 2019-08-06 ENCOUNTER — Other Ambulatory Visit: Payer: Self-pay

## 2019-08-06 VITALS — BP 120/80 | HR 85 | Temp 97.1°F | Ht 67.0 in | Wt 203.0 lb

## 2019-08-06 DIAGNOSIS — M25572 Pain in left ankle and joints of left foot: Secondary | ICD-10-CM | POA: Diagnosis not present

## 2019-08-06 DIAGNOSIS — Z01818 Encounter for other preprocedural examination: Secondary | ICD-10-CM

## 2019-08-06 DIAGNOSIS — K5100 Ulcerative (chronic) pancolitis without complications: Secondary | ICD-10-CM | POA: Diagnosis not present

## 2019-08-06 MED ORDER — CLENPIQ 10-3.5-12 MG-GM -GM/160ML PO SOLN: 1.0000 | Freq: Once | ORAL | 0 refills | Status: AC

## 2019-08-06 NOTE — Patient Instructions (Signed)
You have been scheduled for a colonoscopy. Please follow written instructions given to you at your visit today.  Please pick up your prep supplies at the pharmacy within the next 1-3 days. If you use inhalers (even only as needed), please bring them with you on the day of your procedure. Your physician has requested that you go to www.[email or web address] and enter the access code given to you at your visit today. This web site gives a general overview about your procedure. However, you should still follow specific instructions given to you by our office regarding your preparation for the procedure.  It was a pleasure to see you today!  Vito Cirigliano, D.O.

## 2019-08-06 NOTE — Progress Notes (Signed)
Chief Complaint: Ulcerative Colitis  Referring Provider:     Ernestene Kiel, MD  GI/IBD History: Kristina Gentry is a 59 y.o. female with a history of Ulcerative Colitis, previously followed at [facility].  Diagnosed with pan UC approx 1993 by flex sig, treated with sulfasalazine. Was then hospitalized 1996, treated with Prednisone and changed to Asacol. Eventually transitioned to Imuran, without good clinical relief requiring another course of steroids (was deemed steroid dependent). 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 Was seen by Rheumatology who increased Humira to weekly. Was in reported deep remission for years, and stopped Humira on her own approx 2015. Had been in remission since then until 04/2019 as outlined below.   No hx of polyps. No GI intestinal surgery (uncomplicated C-section x2).   Will still have intermittent arthopathies- toes, ankle on left, L>R hands. No hx of optho or derm EIMs.    Index sxs of hematochezia, increased stool frequency, urgency, loose stools.    Evaluation to date:  - TPMT: N/A (previous Imuran failure) - TB testing: QuantiFERON gold negative 05/2019 - HBV status: HBsAb-, HBsAg-; ordered HBV vaccine today - Pertinent Imaging:  - Last colonoscopy:  - Small bowel imaging:  - History of EIMs:   Medications to date:   Health Maintenance:  - DEXA: Normal 05/2019 (prior history of osteopenia) - Vaccinations:   -Patient hesitant for flu vaccine, pneumonia vaccine, zoster vaccine.  Appropriate for each/all at this time - Micronutrient eval:       - Annual Vit D, B6, iron panel: Order on follow-up with next labs - Annual Pap (if immunosuppressed): UTD - Surveillance colonoscopy: Scheduling today - Surveillance labs for immunomodulators: N/A - Annual depression screening: None - Annual Dermatology/Skin  exam: Ordering follow-up  Endoscopic Hx: -Colonoscopy (2016, [facility]): No active disease    HPI:    Kristina Gentry is a 59 y.o. female referred to the Gastroenterology Clinic to establish for ongoing care of known Ulcerative Colitis with recent flare of index symptoms.  Previously followed with [facility] as outlined above, but changing to  [facility] due to change in insurance.  Most recent flare with increased stool frequency up to 6 BM/day, loose stool, hematochezia, mucus-like stools, abdominal cramping, urgency starting in 04/2019.  Was seen by her GI at [facility] in 05/2019.  Was off all IBD therapy at that time.  Evaluation of the time notable for the following: -Stool studies ordered, not completed due to stool consistency -Normal CMP, CBC -Negative quant gold.  HBsAb-, HBsAg-(needs vaccine) -DEXA normal -Started on prednisone 40 mg/day with taper of 10 mg/week -Refer for colonoscopy.  This was not done due to change in insurance  Since then, sxs have improved. 4-6 formed BM/day. Occasional urgency. Still blood with every BM. No nocturnal stools. Occasional tenesmus. Still with lower abdominal cramping/pressure. Has since tapered off steroids, and sxs started to recur with increasing arthopathies, and PCM placed back on steroid burst/taper x1 week. Off all steroids now.    Past Medical History:  Diagnosis Date  . Acute cystitis with hematuria 05/09/2016  . ADD (attention deficit disorder)   . Atrophic vaginitis 05/09/2016  . Degenerative joint disease   . Easy bruisability 05/09/2016  . Goiter   . H/O degenerative disc disease   . Hypertension   .  Hypothyroidism   . Increased frequency of urination 05/09/2016  . Leg swelling   . Ulcerative colitis ([place])   . Varicose veins   . Women's annual routine gynecological examination 05/09/2016     Past Surgical History:  Procedure Laterality Date  . CESAREAN SECTION  [id number]  . TOTAL THYROIDECTOMY  2008   Family  History  Problem Relation Age of Onset  . COPD Mother   . Emphysema Mother   . Testicular cancer Brother   . Breast cancer Maternal Grandmother   . Heart Problems Maternal Grandfather    Social History   Tobacco Use  . Smoking status: Never Smoker  . Smokeless tobacco: Never Used  Substance Use Topics  . Alcohol use: Yes    Comment: rare  . Drug use: No   Current Outpatient Medications  Medication Sig Dispense Refill  . ALPRAZolam (XANAX) 0.5 MG tablet Take 0.5 mg by mouth 2 (two) times daily as needed for sleep.    . Ascorbic Acid (C-500) 500 MG CHEW Chew 500 mg by mouth daily.    . B Complex Vitamins (VITAMIN B COMPLEX PO) Take 1 capsule by mouth daily.    Marland Kitchen buPROPion (WELLBUTRIN XL) 300 MG 24 hr tablet Take 300 mg by mouth daily.     . Calcium Citrate-Vitamin D (CALCIUM + D PO) Take 1 tablet by mouth 2 (two) times daily.    . Coenzyme Q10 (CO Q10 PO) Take 1 tablet by mouth daily.    Marland Kitchen GLUCOSAMINE-CHONDROITIN PO Take 1 tablet by mouth daily.    Marland Kitchen HYDROcodone-acetaminophen (NORCO/VICODIN) 5-325 MG tablet Take 1 tablet by mouth every 6 (six) hours as needed for moderate pain. 18 tablet 0  . levothyroxine (SYNTHROID, LEVOTHROID) 175 MCG tablet Take 200 mcg by mouth daily.     Marland Kitchen lisdexamfetamine (VYVANSE) 70 MG capsule Take 60 mg by mouth daily.     Marland Kitchen lisinopril (PRINIVIL,ZESTRIL) 10 MG tablet Take 10 mg by mouth daily.    . Multiple Vitamin (MULTIVITAMIN) tablet Take 1 tablet by mouth daily.    . TURMERIC PO Take 1 tablet by mouth daily.    . Adalimumab (HUMIRA) 40 MG/0.4ML PSKT Inject 40 mg into the skin once a week.     No current facility-administered medications for this visit.   No Known Allergies   Review of Systems: All systems reviewed and negative except where noted in HPI.     Physical Exam:    Wt Readings from Last 3 Encounters:  08/06/19 203 lb (92.1 kg)  03/14/17 214 lb 12.8 oz (97.4 kg)  05/07/15 217 lb (98.4 kg)    BP 120/80   Pulse 85   Temp (!)  97.1 F (36.2 C)   Ht 5' 7"  (1.702 m)   Wt 203 lb (92.1 kg)   BMI 31.79 kg/m  Constitutional:  Pleasant, in no acute distress. Psychiatric: Normal mood and affect. Behavior is normal. EENT: Pupils normal.  Conjunctivae are normal. No scleral icterus. Neck supple. No cervical LAD. Cardiovascular: Normal rate, regular rhythm. No edema Pulmonary/chest: Effort normal and breath sounds normal. No wheezing, rales or rhonchi. Abdominal: Soft, nondistended, nontender. Bowel sounds active throughout. There are no masses palpable. No hepatomegaly. Neurological: Alert and oriented to person place and time. Skin: Skin is warm and dry. No rashes noted.   ASSESSMENT AND PLAN;   1) Ulcerative Colitis 59 year old female with longstanding history of pan UC, initially diagnosed in 71.  Was eventually deemed steroid-dependent, and treated with Remicade.  Was  transitioned to Humira, and eventually escalated to weekly therapy.  Was in deep remission and stopped Humira on her own approximately 2015.  Had remained in remission until 04/2019 with recurrence of index symptoms requiring another course of steroids.  Had a long discussion today regarding Ulcerative Colitis, to include treatment options.  No response to 5-ASA therapy, Imuran.  Is steroid responsive, and had done well with Remicade (except arthropathies), and Humira in the past.  Discussed treatment options to include Humira antibody testing and retrial Humira, Felecia Jan, Cimzi, and plan as below:  -Colonoscopy now to assess for disease extent and severity -Discussed labs to include ESR, CRP, stool culture, C. difficile, etc. and declined due to recent labs, symptomatic improvement, etc. -Since she has failed multiple medications in the past, trialed 2 different anti-TNF's with some intolerance to Remicade, and hesitant to go back to Humira due to possibility of antibodies, believe she is a good candidate for Entyvio.  Will initiate  authorization for Entyvio -If unable to obtain Entyvio, can potentially check Humira antibodies, although she is reluctant to go back to an anti-TNF given side effect profile -Start hepatitis B vaccine series -Recommend yearly flu vaccine, obtain pneumococcal vaccine, and zoster vaccine (unless starting immunosuppression) -DEXA in 6-12 months after conclusion of steroids  2) Arthropathy -Suspect joint pains are sequela of her underlying UC.  Will observe for clinical improvement after initiating therapy as above.  If no appreciable improvement, can send back to Rheumatology  The indications, risks, and benefits of colonoscopy were explained to the patient in detail. Risks include but are not limited to bleeding, perforation, adverse reaction to medications, and cardiopulmonary compromise. Sequelae include but are not limited to the possibility of surgery, hospitalization, and mortality. The patient verbalized understanding and wished to proceed. All questions answered, referred to the scheduler and bowel prep ordered. Further recommendations pending results of the exam.     Lavena Bullion, DO, FACG  08/06/2019, 2:44 PM   Ernestene Kiel, MD

## 2019-08-07 ENCOUNTER — Telehealth: Payer: Self-pay

## 2019-08-07 DIAGNOSIS — K5100 Ulcerative (chronic) pancolitis without complications: Secondary | ICD-10-CM

## 2019-08-07 NOTE — Telephone Encounter (Signed)
-----   Message from Angie Fava, LPN sent at 02/07/3275  4:53 PM EST ----- Regarding: Sherwood Gambler, This patient was seen in clinic yesterday. Dr Loletha Grayer Wants her to start Entyvio. Will you please get that set up for her.  Thanks [employer]

## 2019-08-07 NOTE — Telephone Encounter (Signed)
Per Lannette Donath at [employer] they do not cover CPT code [id number] for Entyvio infusions, they only cover vaccines. Pt would be considered self pay for any infusions.  Ref# for my call was: Production designer, theatre/television/film

## 2019-08-07 NOTE — Telephone Encounter (Signed)
Ok, was suspicious of this. Can we reach out to [facility] rep to ask about any patient assistance programs available? If there is nothing, plan will be to send to the lab to check for adalimumab antibodies along with touching base with Humira rep about the same assistance plans. Thanks.

## 2019-08-07 NOTE — Telephone Encounter (Signed)
information faxed to [facility] for [place] start; amb ref order placed in Epic;

## 2019-08-08 NOTE — Telephone Encounter (Signed)
Please assist with this patient-Entyvio rep assistance Thanks Bre

## 2019-08-08 NOTE — Telephone Encounter (Signed)
Attempted to reach patient-phone just "rings" -unable to leave a VM-will attempt to reach patient at a later date/time;

## 2019-08-11 NOTE — Telephone Encounter (Signed)
Attempted to reach patient-unable to leave a VM-will wait for patient to call back to the office for information on plan of care

## 2019-08-12 NOTE — Telephone Encounter (Signed)
Please assist with this patient

## 2019-08-12 NOTE — Telephone Encounter (Signed)
[facility] called to notify us they do not take patient's insurance

## 2019-08-13 ENCOUNTER — Other Ambulatory Visit: Payer: Self-pay | Admitting: Gastroenterology

## 2019-08-13 ENCOUNTER — Ambulatory Visit (INDEPENDENT_AMBULATORY_CARE_PROVIDER_SITE_OTHER): Payer: No Typology Code available for payment source

## 2019-08-13 DIAGNOSIS — Z1159 Encounter for screening for other viral diseases: Secondary | ICD-10-CM

## 2019-08-14 LAB — SARS CORONAVIRUS 2 (TAT 6-24 HRS): SARS Coronavirus 2: NEGATIVE

## 2019-08-15 ENCOUNTER — Other Ambulatory Visit: Payer: Self-pay

## 2019-08-15 ENCOUNTER — Encounter: Payer: Self-pay | Admitting: Gastroenterology

## 2019-08-15 ENCOUNTER — Ambulatory Visit (AMBULATORY_SURGERY_CENTER): Payer: No Typology Code available for payment source | Admitting: Gastroenterology

## 2019-08-15 VITALS — BP 120/50 | HR 68 | Temp 97.1°F | Resp 13 | Ht 67.0 in | Wt 203.0 lb

## 2019-08-15 DIAGNOSIS — K635 Polyp of colon: Secondary | ICD-10-CM

## 2019-08-15 DIAGNOSIS — Z1211 Encounter for screening for malignant neoplasm of colon: Secondary | ICD-10-CM | POA: Diagnosis present

## 2019-08-15 DIAGNOSIS — K5140 Inflammatory polyps of colon without complications: Secondary | ICD-10-CM

## 2019-08-15 DIAGNOSIS — K5180 Other ulcerative colitis without complications: Secondary | ICD-10-CM

## 2019-08-15 DIAGNOSIS — D120 Benign neoplasm of cecum: Secondary | ICD-10-CM | POA: Diagnosis not present

## 2019-08-15 MED ORDER — MESALAMINE 1000 MG RE SUPP: 1000.0000 mg | Freq: Every day | RECTAL | 3 refills | Status: DC

## 2019-08-15 MED ORDER — SODIUM CHLORIDE 0.9 % IV SOLN: 500.0000 mL | Freq: Once | INTRAVENOUS | Status: DC

## 2019-08-15 MED ORDER — MESALAMINE ER 0.375 G PO CP24: 375.0000 mg | ORAL_CAPSULE | Freq: Every day | ORAL | 3 refills | Status: DC

## 2019-08-15 NOTE — Op Note (Signed)
[facility] Patient Name: Kristina Gentry Procedure Date: 08/15/2019 7:54 AM MRN: [id number] Endoscopist: Gerrit Heck , MD Age: 59 Referring MD:  Date of Birth: [birth year] Gender: Female Account #: [id number] Procedure:                Colonoscopy Indications:              Hematochezia, Follow-up of ulcerative colitis                           59 yo female with long-standing hx of                            steroid-responsive UC which had been in clinical                            remission without medications, with recent flare.                            Sxs improved with steroids. Previously treated with                            Imuran, ASA, steroids, Remicade, and most recently                            Humira. Medicines:                Monitored Anesthesia Care Procedure:                Pre-Anesthesia Assessment:                           - Prior to the procedure, a History and Physical                            was performed, and patient medications and                            allergies were reviewed. The patient's tolerance of                            previous anesthesia was also reviewed. The risks                            and benefits of the procedure and the sedation                            options and risks were discussed with the patient.                            All questions were answered, and informed consent                            was obtained. Prior Anticoagulants: The patient has  taken no previous anticoagulant or antiplatelet                            agents. ASA Grade Assessment: II - A patient with                            mild systemic disease. After reviewing the risks                            and benefits, the patient was deemed in                            satisfactory condition to undergo the procedure.                           After obtaining informed consent, the colonoscope               was passed under direct vision. Throughout the                            procedure, the patient's blood pressure, pulse, and                            oxygen saturations were monitored continuously. The                            Colonoscope was introduced through the anus and                            advanced to the the terminal ileum. The colonoscopy                            was performed without difficulty. The patient                            tolerated the procedure well. The quality of the                            bowel preparation was fair. The terminal ileum,                            ileocecal valve, appendiceal orifice, and rectum                            were photographed. Scope In: 8:09:22 AM Scope Out: 8:37:11 AM Scope Withdrawal Time: 0 hours 23 minutes 27 seconds  Total Procedure Duration: 0 hours 27 minutes 49 seconds  Findings:                 The perianal and digital rectal examinations were                            normal.  A 4 mm polyp was found in the cecum. The polyp was                            sessile. The polyp was removed with a cold snare.                            Resection and retrieval were complete. Estimated                            blood loss was minimal.                           Scattered pseudopolyps were found in the sigmoid                            colon. Several of these polyps were removed with a                            cold snare for histologic representative                            evaluation. Resection and retrieval were complete.                           A moderate amount of semi-liquid stool was found in                            the ascending colon and in the cecum, interfering                            with visualization. Lavage of the area was                            performed using copious amounts of sterile water,                            resulting in clearance with  fair visualization.                           Inflammation was found in a continuous and                            circumferential pattern from the anus to the                            sigmoid colon, with transition to normal mucosa at                            55 cm from the anal verge. This was most pronounced                            in the rectum and graded as Mayo Score 2 (moderate,  with marked erythema, absent vascular pattern,                            friability, erosions), with grade 1 disease in the                            sigmoid. There was a small amount of erythematous                            mucosa in the peri-AO cecum. Biopsies were taken                            with a cold forceps for histology. Estimated blood                            loss was minimal.                           Normal mucosa was found in the descending colon, in                            the transverse colon and in the ascending colon.                            Biopsies were taken with a cold forceps for                            histology. Estimated blood loss was minimal.                           The terminal ileum appeared normal.                           Retroflexion in the rectum was not performed due to                            anatomy. Complications:            No immediate complications. Estimated Blood Loss:     Estimated blood loss was minimal. Impression:               - Preparation of the colon was fair.                           - One 4 mm polyp in the cecum, removed with a cold                            snare. Resected and retrieved.                           - Pseudopolyps in the sigmoid colon. Resected and                            retrieved.                           -  Stool in the ascending colon and in the cecum.                           - Moderately active (Mayo Score 2) left-sided                            ulcerative colitis with  probable cecal patch.                            Biopsied.                           - Normal mucosa in the descending colon, in the                            transverse colon and in the ascending colon.                            Biopsied.                           - The examined portion of the ileum was normal. Recommendation:           - Patient has a contact number available for                            emergencies. The signs and symptoms of potential                            delayed complications were discussed with the                            patient. Return to normal activities tomorrow.                            Written discharge instructions were provided to the                            patient.                           - Resume previous diet.                           - Await pathology results.                           - Use Canasa 1000 mg suppository 1 per rectum QHS.                           - Return to GI clinic in 1 month or sooner as                            needed.                           -  Use Apriso 0.375 g 4 caps PO QAM.                           - Entyvio recently declined by insurance. Plan for                            checking antibodies to adalimumab (Humira) for                            possible retrial of treatment.                           - Check BMP in 2 weeks, 6 weeks, and 12 weeks after                            starting ASA therapy. Gerrit Heck, MD 08/15/2019 8:54:43 AM

## 2019-08-15 NOTE — Progress Notes (Signed)
PT taken to PACU. Monitors in place. VSS. Report given to RN. 

## 2019-08-15 NOTE — Progress Notes (Signed)
Pt's states no medical or surgical changes since previsit or office visit.  Temp- Platinum

## 2019-08-15 NOTE — Patient Instructions (Signed)
YOU HAD AN ENDOSCOPIC PROCEDURE TODAY AT [facility]:   Refer to the procedure report that was given to you for any specific questions about what was found during the examination.  If the procedure report does not answer your questions, please call your gastroenterologist to clarify.  If you requested that your care partner not be given the details of your procedure findings, then the procedure report has been included in a sealed envelope for you to review at your convenience later.  YOU SHOULD EXPECT: Some feelings of bloating in the abdomen. Passage of more gas than usual.  Walking can help get rid of the air that was put into your GI tract during the procedure and reduce the bloating. If you had a lower endoscopy (such as a colonoscopy or flexible sigmoidoscopy) you may notice spotting of blood in your stool or on the toilet paper. If you underwent a bowel prep for your procedure, you may not have a normal bowel movement for a few days.  Please Note:  You might notice some irritation and congestion in your nose or some drainage.  This is from the oxygen used during your procedure.  There is no need for concern and it should clear up in a day or so.  SYMPTOMS TO REPORT IMMEDIATELY:   Following lower endoscopy (colonoscopy or flexible sigmoidoscopy):  Excessive amounts of blood in the stool  Significant tenderness or worsening of abdominal pains  Swelling of the abdomen that is new, acute  Fever of 100F or higher    For urgent or emergent issues, a gastroenterologist can be reached at any hour by calling [phone]. Do not use MyChart messaging for urgent concerns.    DIET:  We do recommend a small meal at first, but then you may proceed to your regular diet.  Drink plenty of fluids but you should avoid alcoholic beverages for 24 hours.  MEDICATIONS: Continue present medications. Use Canasa 1027m suppository 1 per rectum at bedtime. Use Apriso 0.375 G 4 capsules by  mouth every morning.   Follow up: EWeyman Rodneyrecently declined by insurance. Plan for checking antibodies to adalimumab (Humira) for possible retrial of treatment. Check BMP in 2 weeks, 6 weeks, and 12 weeks after starting ASA therapy.  Please see handouts given to you by your recovery nurse.  ACTIVITY:  You should plan to take it easy for the rest of today and you should NOT DRIVE or use heavy machinery until tomorrow (because of the sedation medicines used during the test).    FOLLOW UP: Our staff will call the number listed on your records 48-72 hours following your procedure to check on you and address any questions or concerns that you may have regarding the information given to you following your procedure. If we do not reach you, we will leave a message.  We will attempt to reach you two times.  During this call, we will ask if you have developed any symptoms of COVID 19. If you develop any symptoms (ie: fever, flu-like symptoms, shortness of breath, cough etc.) before then, please call ([phone]  If you test positive for Covid 19 in the 2 weeks post procedure, please call and report this information to [facility]    If any biopsies were taken you will be contacted by phone or by letter within the next 1-3 weeks.  Please call [facility] ([phone] you have not heard about the biopsies in 3 weeks.   Thank you for allowing [facility]  to provide for your healthcare needs today.   SIGNATURES/CONFIDENTIALITY: You and/or your care partner have signed paperwork which will be entered into your electronic medical record.  These signatures attest to the fact that that the information above on your After Visit Summary has been reviewed and is understood.  Full responsibility of the confidentiality of this discharge information lies with you and/or your care-partner.

## 2019-08-15 NOTE — Telephone Encounter (Signed)
Left message for [facility] rep for assistance with this patient and Weyman Rodney med

## 2019-08-15 NOTE — Progress Notes (Signed)
Called to room to assist during endoscopic procedure.  Patient ID and intended procedure confirmed with present staff. Received instructions for my participation in the procedure from the performing physician.  

## 2019-08-19 ENCOUNTER — Telehealth: Payer: Self-pay

## 2019-08-19 ENCOUNTER — Telehealth: Payer: Self-pay | Admitting: Gastroenterology

## 2019-08-19 NOTE — Telephone Encounter (Signed)
[facility] is calling- there is mesalamine (Apriso) and mesalamine (Canasa) prescribed. They are calling to see if she is supposed to take both of them.

## 2019-08-19 NOTE — Telephone Encounter (Signed)
  Follow up Call-  Call back number 08/15/2019  Post procedure Call Back phone  # [phone]  Permission to leave phone message Yes  Some recent data might be hidden     Patient questions:  Do you have a fever, pain , or abdominal swelling? No. Pain Score  0 *  Have you tolerated food without any problems? Yes.    Have you been able to return to your normal activities? Yes.    Do you have any questions about your discharge instructions: Diet   No. Medications  No. Follow up visit  No.  Do you have questions or concerns about your Care? No.  Actions: * If pain score is 4 or above: No action needed, pain <4.  Have you developed a fever since your procedure? No 2.   Have you had an respiratory symptoms (SOB or cough) since your procedure? No  3.   Have you tested positive for COVID 19 since your procedure No  4.   Have you had any family members/close contacts diagnosed with the COVID 19 since your procedure?  No  If yes to any of these questions please route to Joylene John, RN and Alphonsa Gin, RN.

## 2019-08-19 NOTE — Telephone Encounter (Signed)
Spoke to pharmacy to clarify medication order.

## 2019-08-20 NOTE — Telephone Encounter (Signed)
Left message for patient to call back to the office;  

## 2019-08-20 NOTE — Telephone Encounter (Signed)
Patient returned call to the office-patient informed that additional paperwork will need to be completed and patient has agreed to come to the office to complete; EntyvioConnect paperwork will be faxed to [facility] as soon as it is completed; Patient advised to call back to the office at [phone] should questions/concerns arise;  Patient verbalized understanding of information/instructions;

## 2019-08-22 ENCOUNTER — Encounter: Payer: Self-pay | Admitting: Gastroenterology

## 2019-08-22 ENCOUNTER — Telehealth: Payer: Self-pay | Admitting: Gastroenterology

## 2019-08-22 NOTE — Telephone Encounter (Signed)
I am very happy to hear that the Rozel seem to be working well.  Recent biopsies demonstrate only active and chronic inflammatory changes in the rectum.  Given issues with Entyvio coverage, may be reasonable to continue just the Apriso/Canasa for now.  Please schedule follow-up appointment for her with me in the next 2-4 weeks for review.

## 2019-08-22 NOTE — Telephone Encounter (Signed)
Called and spoke with patient-patient reports the Apriso and the [place] are "helping....ASSESSMENT: reduction in symptoms-not having that feeling of fullness and the frequency has slowed" "stool that I am passing is now more blackish in color no longer frank red blood" Please advise if further actions are needed  - Entyvio on hold at this point as the patient's insurance is not wanting to cover the medication or the infusion; Patient advised to call back to the office at [phone] should questions/concerns arise;  Patient verbalized understanding of information/instructions;

## 2019-08-22 NOTE — Telephone Encounter (Signed)
Patient called would like to know if it is ok that she comes in either Monday or Tuesday of next week to sign the forms.

## 2019-08-22 NOTE — Telephone Encounter (Signed)
Patient has already been scheduled for an appt= 09/18/2019 at 10:40 am at the [facility] office;

## 2019-08-29 NOTE — Telephone Encounter (Signed)
Pt stated that her internet has been down due to weather and that she will bring paperwork on Monday.

## 2019-09-16 NOTE — Telephone Encounter (Signed)
Paperwork has yet to be signed by the patient- paperwork is being sent to [place] via inter-office mail for keeping until patient is ready to sign-

## 2019-09-16 NOTE — Telephone Encounter (Signed)
Noted. Will look out for paperwork

## 2019-09-18 ENCOUNTER — Ambulatory Visit: Payer: No Typology Code available for payment source | Admitting: Gastroenterology

## 2019-09-23 ENCOUNTER — Telehealth: Payer: Self-pay | Admitting: Gastroenterology

## 2019-09-24 ENCOUNTER — Telehealth: Payer: Self-pay | Admitting: Gastroenterology

## 2019-09-24 NOTE — Telephone Encounter (Signed)
Spoke to [place] at [facility] infusion who states that they need labs faxed over to complete the [facility] referral. TB and Hep B surface antigen faxed over to the provided numbed. Patient has been notified.

## 2019-09-24 NOTE — Telephone Encounter (Signed)
Spoke to patient.Patient has been informed of plane for Entivio infusion.

## 2019-09-29 ENCOUNTER — Telehealth: Payer: Self-pay | Admitting: Gastroenterology

## 2019-09-29 NOTE — Telephone Encounter (Signed)
Patient calling- states she is getting ready to schedule her Entyvio infusion- and also needs to come in to get hep injection. she is asking if she can schedule them at the same time (like same day) or does she needs to wait a certain amount of time in between.

## 2019-09-29 NOTE — Telephone Encounter (Signed)
Spoke to patient this morning. She is scheduled for a follow up appointment with Dr Bryan Lemma along with 1st Hep B injectionon 10/08/19. Patient will wait to schedule Entyvio infusion until after OV. All questions answered.

## 2019-10-08 ENCOUNTER — Ambulatory Visit: Payer: No Typology Code available for payment source | Admitting: Gastroenterology

## 2019-10-08 ENCOUNTER — Other Ambulatory Visit: Payer: Self-pay

## 2019-10-08 ENCOUNTER — Encounter: Payer: Self-pay | Admitting: Gastroenterology

## 2019-10-08 VITALS — BP 118/84 | HR 97 | Temp 98.0°F | Ht 66.5 in | Wt 203.1 lb

## 2019-10-08 DIAGNOSIS — Z23 Encounter for immunization: Secondary | ICD-10-CM

## 2019-10-08 DIAGNOSIS — K5100 Ulcerative (chronic) pancolitis without complications: Secondary | ICD-10-CM | POA: Diagnosis not present

## 2019-10-08 DIAGNOSIS — R140 Abdominal distension (gaseous): Secondary | ICD-10-CM | POA: Diagnosis not present

## 2019-10-08 MED ORDER — DICYCLOMINE HCL 10 MG PO CAPS
10.0000 mg | ORAL_CAPSULE | Freq: Four times a day (QID) | ORAL | 3 refills | Status: DC | PRN
Start: 2019-10-08 — End: 2020-01-06

## 2019-10-08 NOTE — Progress Notes (Signed)
P  Chief Complaint:    Ulcerative Colitis  GI History: Kristina Gentry is a 58 y.o. female with a history of Ulcerative Colitis, previously followed at [facility].  Diagnosed with pan UC approx 1993 by flex sig, treated with sulfasalazine. Was then hospitalized 1996, treated with Prednisone and changed to Asacol. Eventually transitioned to Imuran, without good clinical relief requiring another course of steroids (was deemed steroid dependent). 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 Was seen by Rheumatology who increased Humira to weekly. Was in reported deep remission for years, and stopped Humira on her own approx 2015. Had been in remission since then until her flare in 04/2019.  Flare starting 06/2018, seen by GI at [facility] in 05/2019 (off all IBD therapy at that time) with evaluation n/f: -Stool studies ordered, not completed due to stool consistency -Normal CMP, CBC -Negative quant gold.  HBsAb-, HBsAg- (needs vaccine) -DEXA normal -Started on prednisone 40 mg/day with taper of 10 mg/week -Refer for colonoscopy.  This was not done due to change in insurance -Improvement with prednisone to 4-6 4 BM/day, occasional urgency and tenesmus, still blood with every BM, lower abdominal cramping. -08/06/2019: Initial appointment with me -Declined ESR, CRP, stool studies -Initiated authorization for Entyvio -Colonoscopy 08/2019 with left-sided colitis and cecal patch.  Started on [place] with good clinical response  No hx of polyps. No GI intestinal surgery (uncomplicated C-section x2).   Will still have intermittent arthopathies- toes, ankle on left, L>R hands. No hx of optho or derm EIMs.    Index sxs of hematochezia, increased stool frequency, urgency, loose stools.    Evaluation to date:  - TPMT: N/A (previous Imuran failure) - TB testing:  QuantiFERON gold negative 05/2019 - HBV status: HBsAb-, HBsAg-; ordered HBV vaccine today - Pertinent Imaging:  - Last colonoscopy: 08/15/2019 - Small bowel imaging: None - History of EIMs: ? Arthropathies (Remicade induced?)  Medications to date: Sulfasalazine, prednisone, Asacol, Imuran, Remicade, Humira, Canasa, Apriso Current medications: Canasa, Apriso  Health Maintenance:  - DEXA: Normal 05/2019 (prior history of osteopenia) - Vaccinations:              -Patient hesitant for flu vaccine, pneumonia vaccine, zoster vaccine.  Appropriate for each/all at this time             -Started HBV vaccine series today - Micronutrient eval:       - Annual Vit D, B6, iron panel: Ordered today - Annual Pap (if immunosuppressed): UTD - Surveillance colonoscopy:  UTD - Surveillance labs for immunomodulators: N/A - Annual depression screening: None - Annual Dermatology/Skin exam:  Discuss on follow-up  Endoscopic Hx: -Colonoscopy (08/2019, Dr. Bryan Lemma): 4 mm cecal TA, sigmoid pseudopolyps, continuous female due to inflammation from the anus to sigmoid, with transition at 55 cm, peri-AO inflammation c/w cecal patch.  Normal TI -Colonoscopy (2016, [facility]): No active disease  HPI:     Patient is a 59 y.o. female presenting to [facility] for follow-up.  Initially seen by me on 08/06/2019 with prolonged UC flare.  Subsequent colonoscopy on 08/15/2019 with active left-sided colitis patch.    Was started on Canasa and Apriso with some clinical response initially, but sxs have since started to breakthrough in the last 2.5 weeks. Had to take nearly 2 weeks off of work d/t sxs.  Now with 1-2  formed, but bloody stools/day.  Main complaint is abdominal bloating, intermittent epigastric pain, abdominal distention.  Scheduled for jaw bone extraction this week and implant in 2 weeks.  Will be taking 2 weeks of antibiotics in the periprocedural time.    Review of systems:     No chest  pain, no SOB, no fevers, no urinary sx   Past Medical History:  Diagnosis Date  . Acute cystitis with hematuria 05/09/2016  . ADD (attention deficit disorder)   . Atrophic vaginitis 05/09/2016  . Degenerative joint disease   . Easy bruisability 05/09/2016  . Goiter   . H/O degenerative disc disease   . Hypertension   . Hypothyroidism   . Increased frequency of urination 05/09/2016  . Leg swelling   . Ulcerative colitis ([place])   . Varicose veins   . Women's annual routine gynecological examination 05/09/2016    Patient's surgical history, family medical history, social history, medications and allergies were all reviewed in Epic    Current Outpatient Medications  Medication Sig Dispense Refill  . ALPRAZolam (XANAX) 0.5 MG tablet Take 0.5 mg by mouth 2 (two) times daily as needed for sleep.    . Ascorbic Acid (C-500) 500 MG CHEW Chew 500 mg by mouth daily.    . B Complex Vitamins (VITAMIN B COMPLEX PO) Take 1 capsule by mouth daily.    Marland Kitchen buPROPion (WELLBUTRIN XL) 300 MG 24 hr tablet Take 300 mg by mouth daily.     . Calcium Citrate-Vitamin D (CALCIUM + D PO) Take 1 tablet by mouth 2 (two) times daily.    . Coenzyme Q10 (CO Q10 PO) Take 1 tablet by mouth daily.    Marland Kitchen GLUCOSAMINE-CHONDROITIN PO Take 1 tablet by mouth daily.    Marland Kitchen HYDROcodone-acetaminophen (NORCO/VICODIN) 5-325 MG tablet Take 1 tablet by mouth every 6 (six) hours as needed for moderate pain. 18 tablet 0  . levothyroxine (SYNTHROID, LEVOTHROID) 175 MCG tablet Take 200 mcg by mouth daily.     Marland Kitchen lisdexamfetamine (VYVANSE) 60 MG capsule Take 60 mg by mouth daily.    Marland Kitchen lisdexamfetamine (VYVANSE) 70 MG capsule Take 60 mg by mouth daily.     Marland Kitchen lisinopril (PRINIVIL,ZESTRIL) 10 MG tablet Take 10 mg by mouth daily.    . mesalamine (APRISO) 0.375 g 24 hr capsule Take 1 capsule (0.375 g total) by mouth daily. Take 4 capsules po qam. 120 capsule 3  . mesalamine (CANASA) 1000 MG suppository Place 1 suppository (1,000 mg total) rectally at  bedtime. 30 suppository 3  . Multiple Vitamin (MULTIVITAMIN) tablet Take 1 tablet by mouth daily.    . TURMERIC PO Take 1 tablet by mouth daily.     Current Facility-Administered Medications  Medication Dose Route Frequency Provider Last Rate Last Admin  . 0.9 %  sodium chloride infusion  500 mL Intravenous Once Arles Rumbold V, DO        Physical Exam:     BP 118/84   Pulse 97   Temp 98 F (36.7 C)   Ht 5' 6.5" (1.689 m)   Wt 203 lb 2 oz (92.1 kg)   BMI 32.29 kg/m   GENERAL:  Pleasant female in NAD PSYCH: : Cooperative, normal affect SKIN:  turgor, no lesions seen Musculoskeletal:  Normal muscle tone, normal strength NEURO: Alert and oriented x 3, no focal neurologic deficits   IMPRESSION and PLAN:    1) Ulcerative Colitis 59 year old female with longstanding history of steroid responsive pan UC, initially diagnosed in 63.  Was eventually deemed steroid-dependent, and treated with Remicade.  Was transitioned to Humira, and eventually escalated to weekly therapy.  Was in deep remission and stopped Humira on her own approximately 2015.  Had remained in remission until 04/2019 with recurrence of index symptoms requiring another course of steroids.  Colonoscopy with moderately severe left-sided UC and a cecal patch.  Suboptimal response to trial of Canasa/Apriso.  Has since been approved for Entyvio.  Was scheduled for initial infusion last week, but postponed due to upcoming dental procedures.       -DEXA scan 6 months after completion of steroids (fall 2021) -Annual vitamin D, B6, iron panel -Check CBC -Check BMP (on 5 ASA) -Continue hepatitis B vaccine series -Will need dermatology referral for annual skin check -Transitioning to [facility] -Hopefully can de-escalate from 5-ASA therapy after starting Entyvio -Flexible sigmoidoscopy 6 months after starting Entyvio to gauge clinical response -Did not do ESR, CRP, fecal calprotectin, so unsure of baseline inflammatory markers  during flare -We again discussed the risks, benefits, alternatives of IV therapy, to include 5-ASA therapy and Entyvio  2) Abdominal bloating 3) Abdominal distention GI symptoms could certainly be due to active UC.  Also discussed the possibility of IBS overlap.  Plan to treat as below:  -Low FODMAP diet -Trial of Bentyl -Observe clinical improvement after starting Entyvio as above  RTC in 6 months or sooner as needed  I spent 35 minutes of time, including in depth chart review, independent review of results as outlined above, communicating results with the patient directly, face-to-face time with the patient, coordinating care, and ordering studies and medications as appropriate, and documentation.    Lavena Bullion ,DO, FACG 10/08/2019, 11:43 AM

## 2019-10-08 NOTE — Patient Instructions (Signed)
Low FODMAP Diet: (Fermentable Oligosaccharides, Disaccharides, Monosaccharides, and Polyols) These are short chain carbohydrates and sugar alcohols that are poorly absorbed by the body, resulting in multiple abdominal symptoms, including changes in bowel habits, abdominal pain/discomfort, bloating, abdominal distension, gas, etc.     Your provider has requested that you go to the basement level for lab work at [facility] in [place] [address]. Press "B" on the elevator. The lab is located at the first door on the left as you exit the elevator.  Due to recent changes in healthcare laws, you may see the results of your imaging and laboratory studies on MyChart before your provider has had a chance to review them.  We understand that in some cases there may be results that are confusing or concerning to you. Not all laboratory results come back in the same time frame and the provider may be waiting for multiple results in order to interpret others.  Please give [place] 48 hours in order for your provider to thoroughly review all the results before contacting the office for clarification of your results.   We have sent the following medications to your pharmacy for you to pick up at your convenience: Bentyl  Return to office in 6 months  It was a pleasure to see you today!  Vito Cirigliano, D.O.

## 2019-11-10 ENCOUNTER — Ambulatory Visit (INDEPENDENT_AMBULATORY_CARE_PROVIDER_SITE_OTHER): Payer: No Typology Code available for payment source | Admitting: Gastroenterology

## 2019-11-10 ENCOUNTER — Other Ambulatory Visit: Payer: Self-pay

## 2019-11-10 DIAGNOSIS — Z23 Encounter for immunization: Secondary | ICD-10-CM | POA: Diagnosis not present

## 2019-11-19 NOTE — Telephone Encounter (Signed)
DONE

## 2019-11-21 ENCOUNTER — Telehealth: Payer: Self-pay

## 2019-11-21 NOTE — Telephone Encounter (Signed)
Yes, arthralgias, headache and fatigue are some of the more common side effects of Entyvio infusion. We can try to pre-treat with Tylenol and Benadryl prior to the next infusion.

## 2019-11-21 NOTE — Telephone Encounter (Signed)
Called patient and voicemail was full so I couldn't leave a message

## 2019-11-21 NOTE — Telephone Encounter (Signed)
Patient said with infusion she has been having symptoms. It has been a week since her 2nd infusion and with the 2nd entyvio infusuion that she is having a headache for 2 days. She did experience fatigue and achiness that has resolved. With the first infusion she said that she had an headache, fatigue, achiness and joint pain that had resolved after 48 hours. Said that after first and second had increase bleeding, pain, and frequency in stool (diarrhea) that last 3 to 5 days but resolved. Patient wants to know if symptoms like this is normal? Patient also stated he had a covid test done but didn't get a positive test

## 2019-11-21 NOTE — Telephone Encounter (Signed)
Patient called back and stated that she is already doing a pretreatment with Tylenol 600 and said that she will had a standard dosage of the benadryl. I did tell the patient I can ask Dr C. about a pretreatment regiment for the 2 medications since I wasn't told the precise dosage when she asked me but she declined saying that she has done it in the past with the benadryl and will try it out and see. I told patient to call back if she had any questions

## 2019-12-04 ENCOUNTER — Telehealth: Payer: Self-pay | Admitting: Gastroenterology

## 2019-12-04 ENCOUNTER — Other Ambulatory Visit: Payer: Self-pay | Admitting: Gastroenterology

## 2019-12-04 DIAGNOSIS — K5100 Ulcerative (chronic) pancolitis without complications: Secondary | ICD-10-CM

## 2019-12-04 NOTE — Telephone Encounter (Signed)
Spoke to patient to inform her of which labs are due. She was told that she does not need an appointment. Patient will call her insurance company to go over cost before she has her labs drawn.  We will contact her with results once received.

## 2019-12-04 NOTE — Progress Notes (Signed)
b 6

## 2019-12-23 ENCOUNTER — Other Ambulatory Visit: Payer: Self-pay | Admitting: Gastroenterology

## 2019-12-23 ENCOUNTER — Telehealth: Payer: Self-pay

## 2019-12-23 ENCOUNTER — Telehealth: Payer: Self-pay | Admitting: Gastroenterology

## 2019-12-23 DIAGNOSIS — K5100 Ulcerative (chronic) pancolitis without complications: Secondary | ICD-10-CM

## 2019-12-23 MED ORDER — DICYCLOMINE HCL 10 MG PO CAPS: 20.0000 mg | ORAL_CAPSULE | Freq: Four times a day (QID) | ORAL | 3 refills | Status: DC | PRN

## 2019-12-23 NOTE — Telephone Encounter (Signed)
Spoke to patient who started on Entyvio infusions 10/30/19. She seemed to tolerate her first dose very well. Her 2nd infusion was on 11/13/19. Within 24 hours she started feeling very fatigued with muscle aches and weakness. Her stool is bloody. She reports 15 stools a day with fagments and increased cramping. Bentyl 10 mg every 6 hours is somewhat helpful. Today she reports feeling better with 5 bloody stools,she is due for her next infusion later this week,but is afraid her symptoms will start all over again. Patient is willing to continue if this is a "normal" response with the loading dose. She would like to increase her Bentyl to 20 mg every 6 hours. Please advise.

## 2019-12-23 NOTE — Telephone Encounter (Signed)
Spoke to patient. She agrees with Dr Vivia Ewing recommendations. She will have her labs done today or tomorrow.  Appointment  has been scheduled for 01/06/20 Medication sent to pharmacy. All questions answered patient voiced understanding.

## 2019-12-23 NOTE — Telephone Encounter (Signed)
Unsure if this represents medication failure, superimposed infection, etc.  Plan for further evaluation as below:  -GI PCR panel, C. difficile -ESR, CRP, fecal calprotectin -CBC, BMP -Check vedolizumab trough and antibody levels now (due for third infusion later this week) -Okay to resume Bentyl at 20 mg every 6 hours as needed for abdominal cramping -Follow-up appointment with me or one of the APPs in the next 2 weeks or so.  If with me, okay to overbook

## 2019-12-24 ENCOUNTER — Other Ambulatory Visit (INDEPENDENT_AMBULATORY_CARE_PROVIDER_SITE_OTHER): Payer: No Typology Code available for payment source

## 2019-12-24 DIAGNOSIS — R140 Abdominal distension (gaseous): Secondary | ICD-10-CM | POA: Diagnosis not present

## 2019-12-24 DIAGNOSIS — K5100 Ulcerative (chronic) pancolitis without complications: Secondary | ICD-10-CM

## 2019-12-24 LAB — CBC WITH DIFFERENTIAL/PLATELET
Basophils Absolute: 0.1 10*3/uL (ref 0.0–0.1)
Basophils Relative: 1.2 % (ref 0.0–3.0)
Eosinophils Absolute: 0.6 10*3/uL (ref 0.0–0.7)
Eosinophils Relative: 7.3 % — ABNORMAL HIGH (ref 0.0–5.0)
HCT: 30.8 % — ABNORMAL LOW (ref 36.0–46.0)
Hemoglobin: 10.1 g/dL — ABNORMAL LOW (ref 12.0–15.0)
Lymphocytes Relative: 32.1 % (ref 12.0–46.0)
Lymphs Abs: 2.7 10*3/uL (ref 0.7–4.0)
MCHC: 32.9 g/dL (ref 30.0–36.0)
MCV: 90.2 fl (ref 78.0–100.0)
Monocytes Absolute: 0.6 10*3/uL (ref 0.1–1.0)
Monocytes Relative: 7.6 % (ref 3.0–12.0)
Neutro Abs: 4.3 10*3/uL (ref 1.4–7.7)
Neutrophils Relative %: 51.8 % (ref 43.0–77.0)
Platelets: 494 10*3/uL — ABNORMAL HIGH (ref 150.0–400.0)
RBC: 3.41 Mil/uL — ABNORMAL LOW (ref 3.87–5.11)
RDW: 13.9 % (ref 11.5–15.5)
WBC: 8.3 10*3/uL (ref 4.0–10.5)

## 2019-12-24 LAB — BASIC METABOLIC PANEL
BUN: 14 mg/dL (ref 6–23)
BUN: 14 mg/dL (ref 6–23)
CO2: 27 mEq/L (ref 19–32)
CO2: 27 mEq/L (ref 19–32)
Calcium: 8.8 mg/dL (ref 8.4–10.5)
Calcium: 8.8 mg/dL (ref 8.4–10.5)
Chloride: 102 mEq/L (ref 96–112)
Chloride: 102 mEq/L (ref 96–112)
Creatinine, Ser: 0.87 mg/dL (ref 0.40–1.20)
Creatinine, Ser: 0.87 mg/dL (ref 0.40–1.20)
GFR: 66.92 mL/min (ref >60.00)
GFR: 66.92 mL/min (ref >60.00)
Glucose, Bld: 90 mg/dL (ref 70–99)
Glucose, Bld: 90 mg/dL (ref 70–99)
Potassium: 4 mEq/L (ref 3.5–5.1)
Potassium: 4 mEq/L (ref 3.5–5.1)
Sodium: 136 mEq/L (ref 135–145)
Sodium: 136 mEq/L (ref 135–145)

## 2019-12-24 LAB — IBC + FERRITIN
Ferritin: 28.4 ng/mL (ref 10.0–291.0)
Iron: 18 ug/dL — ABNORMAL LOW (ref 42–145)
Saturation Ratios: 6.3 % — ABNORMAL LOW (ref 20.0–50.0)
Transferrin: 205 mg/dL — ABNORMAL LOW (ref 212.0–360.0)

## 2019-12-24 LAB — HIGH SENSITIVITY CRP: CRP, High Sensitivity: 29.31 mg/L — ABNORMAL HIGH (ref 0.000–5.000)

## 2019-12-24 LAB — SEDIMENTATION RATE: Sed Rate: 99 mm/hr — ABNORMAL HIGH (ref 0–30)

## 2019-12-24 LAB — VITAMIN D 25 HYDROXY (VIT D DEFICIENCY, FRACTURES): VITD: 29.38 ng/mL — ABNORMAL LOW (ref 30.00–100.00)

## 2019-12-25 ENCOUNTER — Other Ambulatory Visit: Payer: Self-pay | Admitting: Gastroenterology

## 2019-12-25 DIAGNOSIS — K5100 Ulcerative (chronic) pancolitis without complications: Secondary | ICD-10-CM

## 2019-12-27 ENCOUNTER — Other Ambulatory Visit: Payer: Self-pay | Admitting: Gastroenterology

## 2019-12-29 NOTE — Telephone Encounter (Signed)
Refill ok. Awaiting Entyvio AB and trough labs to dtermine if she should go for next infusion vs change in therapy. To keep appt next week as scheduled.

## 2020-01-01 LAB — SERIAL MONITORING

## 2020-01-02 LAB — VEDOLIZUMAB AND ANTI-VEDO AB
Anti-Vedolizumab Antibody: <25 ng/mL
Vedolizumab: 14 ug/mL

## 2020-01-06 ENCOUNTER — Ambulatory Visit: Payer: No Typology Code available for payment source | Admitting: Gastroenterology

## 2020-01-06 ENCOUNTER — Encounter: Payer: Self-pay | Admitting: Gastroenterology

## 2020-01-06 VITALS — BP 120/90 | HR 76 | Ht 67.0 in | Wt 199.2 lb

## 2020-01-06 DIAGNOSIS — R5383 Other fatigue: Secondary | ICD-10-CM

## 2020-01-06 DIAGNOSIS — K921 Melena: Secondary | ICD-10-CM | POA: Diagnosis not present

## 2020-01-06 DIAGNOSIS — K5100 Ulcerative (chronic) pancolitis without complications: Secondary | ICD-10-CM | POA: Diagnosis not present

## 2020-01-06 DIAGNOSIS — D509 Iron deficiency anemia, unspecified: Secondary | ICD-10-CM

## 2020-01-06 MED ORDER — PREDNISONE 20 MG PO TABS: ORAL_TABLET | ORAL | 0 refills | Status: DC

## 2020-01-06 MED ORDER — PREDNISONE 10 MG PO TABS
ORAL_TABLET | ORAL | 0 refills | Status: DC
Start: 2020-01-06 — End: 2020-01-20

## 2020-01-06 NOTE — Patient Instructions (Signed)
We have sent the following medications to your pharmacy for you to pick up at your convenience: Prednisone  65m tablets:  Take 484m(2tab) for 2 weeks then 3066m1/2 tab) for 1 week then 33m71mtab) for 1 weeks then  10mg13mlets:  Take 15mg 79mtab) for 1 week then 10mg (59m) for 1 week then .5mg ( (58m tab for 1 week then stop

## 2020-01-06 NOTE — Progress Notes (Signed)
P  Chief Complaint:    Ulcerative Colitis, diarrhea, fatigue  GI History: MARVELL STAVOLA a 59 y.o.femalewith a history of steroid responsive Panulcerative Colitis, previously followed at [facility]. Diagnosed with pan UC approx 1993 by flex sig,treated with sulfasalazine. Was then hospitalized 1996, treated with Prednisone and changed to Asacol.Eventually transitioned to Imuran, without good clinical relief requiringanother course ofsteroids(was deemed steroid dependent). 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 Was seen by Rheumatology who increased Humira to weekly. Was in reported deep remission for years, and stoppedHumiraon her own approx 2015. Had been in remission since then until her flare in 04/2019.  Flare starting 06/2018, seen by GI at [facility] in 05/2019 (off all IBD therapy at that time) with evaluation n/f: -Stoolstudies ordered, not completed due to stool consistency -Normal CMP, CBC -Negative quant gold. HBsAb-, HBsAg- (needs vaccine) -DEXA normal -Started on prednisone 40 mg/day with taper of 10 mg/week -Refer for colonoscopy. This was not done due to change in insurance -Improvement with prednisone to 4-6 4 BM/day, occasional urgency and tenesmus, still blood with every BM, lower abdominal cramping. -08/06/2019: Initial appointment with me -Declined ESR, CRP, stool studies -Initiated authorization for Entyvio -Colonoscopy 08/2019 with left-sided colitis and cecal patch.  Started on [place] with good clinical response -Started Entyvio 10/2019  No hx of polyps. No GI intestinal surgery (uncomplicated C-section x2).   Will still have intermittent arthopathies- toes, ankle on left, L>R hands. No hx of optho or derm EIMs.   Index sxs of hematochezia, increased stool frequency, urgency, loose stools.  Evaluation  to date:  - TPMT:N/A (previous Imuran failure) - TB testing:QuantiFERON goldnegative12/2020 - HBV status:HBsAb-, HBsAg-;started HBVvaccine series - Pertinent Imaging:  - Last colonoscopy: 08/15/2019 - Small bowel imaging: None - History of EIMs: ? Arthropathies (Remicade induced?)  Medications to date: Sulfasalazine, prednisone, Asacol, Imuran, Remicade, Humira, Canasa, Apriso Current medications: Cher Nakai, Georgiann Mccoy  Health Maintenance:  - DEXA:Normal 05/2019 (prior history of osteopenia) - Vaccinations: -Patient hesitant for flu vaccine, pneumonia vaccine, zoster vaccine. Appropriate for each/all at this time             -Started HBV vaccine series today - Micronutrient eval:  - Annual Vit D, B6, iron panel: Previously ordered; not done - Annual Pap (if immunosuppressed):UTD - Surveillance colonoscopy: UTD - Surveillance labs for immunomodulators:N/A - Annual depression screening:None - Annual Dermatology/Skin exam: Discuss on follow-up  Endoscopic Hx: -Colonoscopy (08/2019, Dr. Bryan Lemma): 4 mm cecal TA, sigmoid pseudopolyps, continuous female due to inflammation from the anus to sigmoid, with transition at 55 cm, peri-AO inflammation c/w cecal patch.  Normal TI -Colonoscopy (2016, [facility]): No active disease  HPI:     Patient is a 59 y.o. female presenting to the Gastroenterology Clinic for follow-up.  Reports having ongoing active UC symptoms; 12-20 bloody stools/day along with fatigue. Last vedolizumab infusion was 11/13/19 (dose #2). Did not have dose on 12/11/19, and has held off on scheduling dose #3 due to active symptoms and delay in receipt of Entyvio labs as outlined below.    Recent work-up for active symptoms as below: -Vedolizumab trough 14, antibody negative -Vitamin D 29.4 -ferritin 28, iron 18, sat 6.3%; started on ferrous sulfate -normal WBC, H/H 10.1/30.8 (baseline Hgb ~12.8). BMP normal -ESR 99, CRP 29.3 -Stool studies  ordered but not done  Review of systems:  No chest pain, no SOB, no fevers, no urinary sx   Past Medical History:  Diagnosis Date  . Acute cystitis with hematuria 05/09/2016  . ADD (attention deficit disorder)   . Atrophic vaginitis 05/09/2016  . Degenerative joint disease   . Easy bruisability 05/09/2016  . Goiter   . H/O degenerative disc disease   . Hypertension   . Hypothyroidism   . Increased frequency of urination 05/09/2016  . Leg swelling   . Ulcerative colitis ([place])   . Varicose veins   . Women's annual routine gynecological examination 05/09/2016    Patient's surgical history, family medical history, social history, medications and allergies were all reviewed in Epic    Current Outpatient Medications  Medication Sig Dispense Refill  . ALPRAZolam (XANAX) 0.5 MG tablet Take 0.5 mg by mouth 2 (two) times daily as needed for sleep.    . Ascorbic Acid (C-500) 500 MG CHEW Chew 500 mg by mouth daily.    . B Complex Vitamins (VITAMIN B COMPLEX PO) Take 1 capsule by mouth daily.    Marland Kitchen buPROPion (WELLBUTRIN XL) 300 MG 24 hr tablet Take 300 mg by mouth daily.     . Calcium Citrate-Vitamin D (CALCIUM + D PO) Take 1 tablet by mouth 2 (two) times daily.    . Coenzyme Q10 (CO Q10 PO) Take 1 tablet by mouth daily.    Marland Kitchen dicyclomine (BENTYL) 10 MG capsule Take 2 capsules (20 mg total) by mouth every 6 (six) hours as needed for spasms. 60 capsule 3  . ferrous sulfate 325 (65 FE) MG tablet Take 325 mg by mouth daily with breakfast.    . GLUCOSAMINE-CHONDROITIN PO Take 1 tablet by mouth daily.    Marland Kitchen HYDROcodone-acetaminophen (NORCO/VICODIN) 5-325 MG tablet Take 1 tablet by mouth every 6 (six) hours as needed for moderate pain. 18 tablet 0  . levothyroxine (SYNTHROID, LEVOTHROID) 175 MCG tablet Take 200 mcg by mouth daily.     Marland Kitchen lisdexamfetamine (VYVANSE) 60 MG capsule Take 60 mg by mouth every morning.    Marland Kitchen lisinopril (PRINIVIL,ZESTRIL) 10 MG tablet Take 10 mg by mouth daily.    . mesalamine  (APRISO) 0.375 g 24 hr capsule TAKE 4 CAPSULES BY MOUTH EVERY DAY IN THE MORNING 120 capsule 3  . mesalamine (CANASA) 1000 MG suppository Place 1 suppository (1,000 mg total) rectally at bedtime. 30 suppository 3  . Multiple Vitamin (MULTIVITAMIN) tablet Take 1 tablet by mouth daily.    . TURMERIC PO Take 1 tablet by mouth daily.     Current Facility-Administered Medications  Medication Dose Route Frequency Provider Last Rate Last Admin  . 0.9 %  sodium chloride infusion  500 mL Intravenous Once Karisha Marlin V, DO        Physical Exam:     BP 120/90 (BP Location: Left Arm, Patient Position: Sitting, Cuff Size: Normal)   Pulse 76   Ht 5' 7"  (1.702 m)   Wt 199 lb 4 oz (90.4 kg)   BMI 31.21 kg/m   GENERAL:  Pleasant female in NAD PSYCH: : Cooperative, normal affect Musculoskeletal:  Normal muscle tone, normal strength NEURO: Alert and oriented x 3, no focal neurologic deficits   IMPRESSION and PLAN:    1) Ulcerative Colitis 2) Hematochezia/Diarrhea 3) Fatigue  59 year old female with longstanding history of steroid responsive pan UC, initially diagnosed in 30. Was eventually deemed steroid-dependent, and treated with Remicade. Was transitioned to Humira, and eventually escalated to weekly therapy. Was in deep remission and stopped  Humira on her own approximately 2015. Had remained in remission until 04/2019 with recurrence of index symptoms requiring another course of steroids.  Colonoscopy with moderately severe left-sided UC and a cecal patch.  Suboptimal response to trial of Canasa/Apriso.    Started Weyman Rodney 10/2019.  Has received 2 infusions in total, but was having breakthrough symptoms after dose #2.    -Since the trough was actually collected nearly 2 weeks after dose #3 was due, this does not represent a terribly low induction trough level.  Antibodies were otherwise negative.  I think it is reasonable to restart the [facility] and give this some time, as trials show that  Entyvio could take up to 12 weeks to induce remission -Restart Entyvio.  Next dose should be 4 weeks later as a "mini load", and would actually continue at 4-week interval with repeat trough level prior to the fourth dose from now for targeted, proactive monitoring -In the meantime, will restart prednisone to induce remission given significant impact to QOL and continuously missing work due to symptoms.  40 mg/day x2 weeks then taper - Complete stool studies today as were previously ordered - Bentyl ok for spasm  4) Iron deficiency anemia: -Related to her underlying active UC -Was started on ferrous sulfate -Plan for repeat CBC and iron panel in another 3 months or so when you see his hopefully in remission  - RTC in 2 months or sooner prn  I spent 35 minutes of time, including in depth chart review, independent review of results as outlined above, communicating results with the patient directly, face-to-face time with the patient, coordinating care, and ordering studies and medications as appropriate, and documentation.           Lavena Bullion ,DO, FACG 01/06/2020, 10:41 AM

## 2020-01-19 ENCOUNTER — Other Ambulatory Visit: Payer: Self-pay | Admitting: Gastroenterology

## 2020-01-27 ENCOUNTER — Ambulatory Visit: Payer: No Typology Code available for payment source | Admitting: Gastroenterology

## 2020-02-25 ENCOUNTER — Telehealth: Payer: Self-pay | Admitting: Gastroenterology

## 2020-02-25 NOTE — Telephone Encounter (Signed)
Spoke to patient who has been on Entyvio infusions since 10/30/19. She has had 2 infusions so far that she has not tolerated very well. Her symptoms of fatigue muscle pain and abdominal cramping with bloody diarrhea  let up with tapering steroid dose. Currently at 10 mg. She is scheduled for her 3rd infusion on 10/5/21but does not want to do it as she feels non functioning for 3 weeks afterwards. Patient is requesting a change in her medication. An appointment has been scheduled for 03/04/20 with Dr Bryan Lemma to discus her next plan of care. She will call sooner if symptoms worsen.

## 2020-02-25 NOTE — Telephone Encounter (Signed)
Thanks for scheduling the expedited appt to discuss.

## 2020-02-25 NOTE — Telephone Encounter (Signed)
Patient called states she would like to speak to a nurse about changing medications

## 2020-03-04 ENCOUNTER — Other Ambulatory Visit (INDEPENDENT_AMBULATORY_CARE_PROVIDER_SITE_OTHER): Payer: No Typology Code available for payment source

## 2020-03-04 ENCOUNTER — Encounter: Payer: Self-pay | Admitting: Gastroenterology

## 2020-03-04 ENCOUNTER — Ambulatory Visit (INDEPENDENT_AMBULATORY_CARE_PROVIDER_SITE_OTHER): Payer: No Typology Code available for payment source | Admitting: Gastroenterology

## 2020-03-04 VITALS — BP 118/90 | HR 97 | Ht 67.0 in | Wt 178.5 lb

## 2020-03-04 DIAGNOSIS — K921 Melena: Secondary | ICD-10-CM

## 2020-03-04 DIAGNOSIS — K51919 Ulcerative colitis, unspecified with unspecified complications: Secondary | ICD-10-CM | POA: Diagnosis not present

## 2020-03-04 DIAGNOSIS — D509 Iron deficiency anemia, unspecified: Secondary | ICD-10-CM

## 2020-03-04 DIAGNOSIS — R5383 Other fatigue: Secondary | ICD-10-CM | POA: Diagnosis not present

## 2020-03-04 LAB — COMPREHENSIVE METABOLIC PANEL
ALT: 11 U/L (ref 0–35)
AST: 10 U/L (ref 0–37)
Albumin: 3.4 g/dL — ABNORMAL LOW (ref 3.5–5.2)
Alkaline Phosphatase: 50 U/L (ref 39–117)
BUN: 12 mg/dL (ref 6–23)
CO2: 26 mEq/L (ref 19–32)
Calcium: 8.9 mg/dL (ref 8.4–10.5)
Chloride: 103 mEq/L (ref 96–112)
Creatinine, Ser: 0.93 mg/dL (ref 0.40–1.20)
GFR: 61.92 mL/min (ref >60.00)
Glucose, Bld: 98 mg/dL (ref 70–99)
Potassium: 4.2 mEq/L (ref 3.5–5.1)
Sodium: 137 mEq/L (ref 135–145)
Total Bilirubin: 0.3 mg/dL (ref 0.2–1.2)
Total Protein: 6.9 g/dL (ref 6.0–8.3)

## 2020-03-04 LAB — CBC WITH DIFFERENTIAL/PLATELET
Basophils Absolute: 0.1 10*3/uL (ref 0.0–0.1)
Basophils Relative: 0.7 % (ref 0.0–3.0)
Eosinophils Absolute: 0 10*3/uL (ref 0.0–0.7)
Eosinophils Relative: 0.2 % (ref 0.0–5.0)
HCT: 28.1 % — ABNORMAL LOW (ref 36.0–46.0)
Hemoglobin: 8.9 g/dL — ABNORMAL LOW (ref 12.0–15.0)
Lymphocytes Relative: 17 % (ref 12.0–46.0)
Lymphs Abs: 1.4 10*3/uL (ref 0.7–4.0)
MCHC: 31.8 g/dL (ref 30.0–36.0)
MCV: 87.2 fl (ref 78.0–100.0)
Monocytes Absolute: 0.5 10*3/uL (ref 0.1–1.0)
Monocytes Relative: 6 % (ref 3.0–12.0)
Neutro Abs: 6.2 10*3/uL (ref 1.4–7.7)
Neutrophils Relative %: 76.1 % (ref 43.0–77.0)
Platelets: 768 10*3/uL — ABNORMAL HIGH (ref 150.0–400.0)
RBC: 3.22 Mil/uL — ABNORMAL LOW (ref 3.87–5.11)
RDW: 18.1 % — ABNORMAL HIGH (ref 11.5–15.5)
WBC: 8.2 10*3/uL (ref 4.0–10.5)

## 2020-03-04 LAB — IBC + FERRITIN
Ferritin: 19.8 ng/mL (ref 10.0–291.0)
Iron: 16 ug/dL — ABNORMAL LOW (ref 42–145)
Saturation Ratios: 5.2 % — ABNORMAL LOW (ref 20.0–50.0)
Transferrin: 219 mg/dL (ref 212.0–360.0)

## 2020-03-04 LAB — HIGH SENSITIVITY CRP: CRP, High Sensitivity: 21.26 mg/L — ABNORMAL HIGH (ref 0.000–5.000)

## 2020-03-04 LAB — SEDIMENTATION RATE: Sed Rate: 109 mm/hr — ABNORMAL HIGH (ref 0–30)

## 2020-03-04 MED ORDER — PREDNISONE 5 MG PO TABS
ORAL_TABLET | ORAL | 0 refills | Status: DC
Start: 2020-03-04 — End: 2020-04-08

## 2020-03-04 NOTE — Progress Notes (Signed)
P  Chief Complaint:    Ulcerative colitis, Change in medical management, medication questions  GI History: OMER MONTER a 59 y.o.femalewith a history of steroid responsive Panulcerative Colitis, previously followed at [facility]. Diagnosed with pan UC approx 1993 by flex sig,treated with sulfasalazine. Was then hospitalized 1996, treated with Prednisone and changed to Asacol.Eventually transitioned to Imuran, without good clinical relief requiringanother course ofsteroids(was deemed steroid dependent). 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 Was seen by Rheumatology who increased Humira to weekly. Was in reported deep remission for years, and stoppedHumiraon her own approx 2015. Had been in remission since then untilher flare in11/2020.  Flare starting 06/2018, seen by GI at [facility] in 05/2019 (off all IBD therapy at that time) with evaluationn/f: -Stoolstudies ordered, not completed due to stool consistency -Normal CMP, CBC -Negative quant gold. HBsAb-, HBsAg- (needs vaccine) -DEXA normal -Started on prednisone 40 mg/day with taper of 10 mg/week -Refer for colonoscopy. This was not done due to change in insurance -Improvement with prednisoneto4-6 4 BM/day, occasional urgency and tenesmus, still blood with every BM, lower abdominal cramping. -08/06/2019: Initial appointment withme -Declined ESR, CRP, stool studies -Initiated authorization for Entyvio -Colonoscopy 08/2019 with left-sided colitis and cecal patch. Started on [place] with good clinical response -Started Entyvio 10/2019 -Ongoing active symptoms despite Entyvio.  Vedolizumab trough 14, antibody negative.  ESR 99, CRP 29.3. -03/04/2020: Still with active symptoms after completion of loading dose  No hx of polyps. No GI intestinal surgery (uncomplicated C-section  x2).   Will still have intermittent arthopathies- toes, ankle on left, L>R hands. No hx of optho or derm EIMs.   Index sxs of hematochezia, increased stool frequency, urgency, loose stools.  Evaluation to date:  - TPMT:N/A (previous Imuran failure) - TB testing:QuantiFERON goldnegative12/2020 - HBV status:HBsAb-, HBsAg-;started HBVvaccine series - Pertinent Imaging:  - Last colonoscopy:08/15/2019 - Small bowel imaging:None - History of EIMs:?Arthropathies (Remicade induced?)  Medications to date:Sulfasalazine, prednisone, Asacol, Imuran, Remicade, Humira, Canasa, Apriso Current medications: Ranae Pila, prednisone  Health Maintenance:  - DEXA:Normal 05/2019 (prior history of osteopenia) - Vaccinations: -Patient hesitant for flu vaccine, pneumonia vaccine, zoster vaccine. Appropriate for each/all at this time -Started HBV vaccine series today - Micronutrient eval:  - Annual Vit D, B6, iron panel: Previously ordered; not done - Annual Pap (if immunosuppressed):UTD - Surveillance colonoscopy:UTD - Surveillance labs for immunomodulators:N/A - Annual depression screening:None - Annual Dermatology/Skin exam:Discuss on follow-up  Endoscopic Hx: -Colonoscopy (08/2019, Dr. Bryan Lemma): 4 mm cecalTA, sigmoid pseudopolyps, continuous female due to inflammation from the anus to sigmoid, with transition at 55 cm, peri-AOinflammationc/wcecal patch. Normal TI -Colonoscopy (2016, [facility]): No active disease  HPI:     Patient is a 59 y.o. female presenting to the Gastroenterology Clinic for follow-up.  Was last seen by me on 02/02/2020.  At that time, was still having active symptoms with 12-20 bloody stools/day and fatigue despite completing 2 doses of vedolizumab.  Had appropriate trough with negative antibody, but elevated inflammatory markers as outlined above.  At that time, continued Entyvio, and has since completed  loading dose.  Had planned on dose #4 to be just 4 weeks later as part of a "mini load", along with restarting prednisone due to significant impact to QOL and continuously missing work.  Repeat trough levels is cost prohibitive.  Today, she states symptoms improved since  starting the prednisone 40 mg/day.  Has started to have increasing symptoms of bloody diarrhea, abdominal cramping, fatigue, muscle pain with steroid taper, starting 20 mg/day.  Currently at 10 mg/day.  Is scheduled for dose #4 on 03/09/2020, but feels breakthrough symptoms represent failure of Entyvio and wants to discuss change in medical management.    Continues to have fatigue and cannot go to work.   Does have iron deficiency anemia 2/2 ulcerative colitis.  Review of systems:     No chest pain, no SOB, no fevers, no urinary sx   Past Medical History:  Diagnosis Date  . Acute cystitis with hematuria 05/09/2016  . ADD (attention deficit disorder)   . Atrophic vaginitis 05/09/2016  . Degenerative joint disease   . Easy bruisability 05/09/2016  . Goiter   . H/O degenerative disc disease   . Hypertension   . Hypothyroidism   . Increased frequency of urination 05/09/2016  . Leg swelling   . Ulcerative colitis ([place])   . Varicose veins   . Women's annual routine gynecological examination 05/09/2016    Patient's surgical history, family medical history, social history, medications and allergies were all reviewed in Epic    Current Outpatient Medications  Medication Sig Dispense Refill  . ALPRAZolam (XANAX) 0.5 MG tablet Take 0.5 mg by mouth 2 (two) times daily as needed for sleep.    . Ascorbic Acid (C-500) 500 MG CHEW Chew 500 mg by mouth daily.    . B Complex Vitamins (VITAMIN B COMPLEX PO) Take 1 capsule by mouth daily.    Marland Kitchen buPROPion (WELLBUTRIN XL) 300 MG 24 hr tablet Take 300 mg by mouth daily.     . Calcium Citrate-Vitamin D (CALCIUM + D PO) Take 1 tablet by mouth 2 (two) times daily.    . Coenzyme Q10 (CO Q10 PO)  Take 1 tablet by mouth daily.    Marland Kitchen dicyclomine (BENTYL) 10 MG capsule Take 2 capsules (20 mg total) by mouth every 6 (six) hours as needed for spasms. 60 capsule 3  . ferrous sulfate 325 (65 FE) MG tablet Take 325 mg by mouth daily with breakfast.    . GLUCOSAMINE-CHONDROITIN PO Take 1 tablet by mouth daily.    Marland Kitchen HYDROcodone-acetaminophen (NORCO/VICODIN) 5-325 MG tablet Take 1 tablet by mouth every 6 (six) hours as needed for moderate pain. 18 tablet 0  . levothyroxine (SYNTHROID, LEVOTHROID) 175 MCG tablet Take 200 mcg by mouth daily.     Marland Kitchen lisdexamfetamine (VYVANSE) 60 MG capsule Take 60 mg by mouth every morning.    Marland Kitchen lisinopril (PRINIVIL,ZESTRIL) 10 MG tablet Take 10 mg by mouth daily.    . mesalamine (APRISO) 0.375 g 24 hr capsule TAKE 4 CAPSULES BY MOUTH EVERY DAY IN THE MORNING 120 capsule 3  . mesalamine (CANASA) 1000 MG suppository Place 1 suppository (1,000 mg total) rectally at bedtime. 30 suppository 3  . Multiple Vitamin (MULTIVITAMIN) tablet Take 1 tablet by mouth daily.    . predniSONE (DELTASONE) 20 MG tablet TAKE 2 TABLETS BY MOUTH FOR 14 DAYS THEN 1 & 1/2 TABLET FOR 7 DAYS THEN 1 TABLET DAILY (Patient taking differently: Take 10 mg by mouth daily. ) 48 tablet 0  . TURMERIC PO Take 1 tablet by mouth daily.     Current Facility-Administered Medications  Medication Dose Route Frequency Provider Last Rate Last Admin  . 0.9 %  sodium chloride infusion  500 mL Intravenous Once Edwardine Deschepper V, DO        Physical Exam:  BP 118/90   Pulse 97   Ht 5' 7"  (1.702 m)   Wt 178 lb 8 oz (81 kg)   BMI 27.96 kg/m   GENERAL:  Pleasant female in NAD PSYCH: : Cooperative, normal affect NEURO: Alert and oriented x 3, no focal neurologic deficits   IMPRESSION and PLAN:    1) Ulcerative Pancolitis 2) Hematochezia/Diarrhea 3) Fatigue  59 year old female with longstanding history ofsteroid responsivepan UC, initially diagnosed in 69. Was eventually deemed steroid-dependent,  and treated with Remicade. Was transitioned to Humira, and eventually escalated to weekly therapy. Was in deep remission and stopped Humira on her own approximately 2015. Had remained in remission until 04/2019 with recurrence of index symptoms requiring another course of steroids.Colonoscopy with moderately severe left-sided UC and a cecal patch. Suboptimal response to trial of Canasa/Apriso.   Started Weyman Rodney 10/2019.  Has received 3 infusions in total, but was having breakthrough symptoms after dose #2.  Had to restart prednisone due to treatment failure, with improvement in symptoms.  Now with breakthrough after tapering below 20 mg/day.  Had a long conversation with Ree today regarding ongoing treatment options.  Agree that this represents failure of Entyvio, and will discontinue.  Discussed options to include 1) re-trialing one of the previous anti-TNFs, 2) Stelara, 3) Combination therapy with anti-TNF and immunomodulators or MTX, 4) Cimzia, 5) trial of Xeljanz.  Her preference is to change to Stelara if possible, and will plan for the following:  -Increase prednisone back to 30 mg/day for the next 4 weeks with plan for slow taper due to significant impact to QOL with her ongoing symptoms and while awaiting approval/work-up of change in therapy as below -Submit approval for Ustekinumab 390 milligrams IV then 90 mg SQ every 8 weeks -Check Humira antibody.  If antibody negative and Stelara not well covered/cost prohibitive, plan for restarting Humira -She feels less inclined to start combination therapy with an immunomodulator given increased side effect profile.  Held off on sending TPMT -Check CBC, CMP, ESR, CRP, ferritin, iron panel -Will need hepatitis A vaccine series -Will plan to discuss at next IBD conference -Follow-up with me in 4 weeks or sooner as needed  4) Iron deficiency anemia: -Secondary to underlying active Ulcerative Colitis -Tolerating ferrous sulfate -Repeat CBC  with iron panel as above.  If low, plan for IV iron      I spent 45 minutes of time, including in depth chart review, independent review of results as outlined above, communicating results with the patient directly, face-to-face time with the patient, coordinating care, ordering studies and medications as appropriate, and documentation.      [place] ,DO, FACG 03/04/2020, 10:40 AM

## 2020-03-04 NOTE — Patient Instructions (Addendum)
If you are age 58 or older, your body mass index should be between 23-30. Your Body mass index is 27.96 kg/m. If this is out of the aforementioned range listed, please consider follow up with your Primary Care Provider.  If you are age 55 or younger, your body mass index should be between 19-25. Your Body mass index is 27.96 kg/m. If this is out of the aformentioned range listed, please consider follow up with your Primary Care Provider.   Your provider has requested that you go to the basement level for lab work at [facility], [place], [place] before leaving today. Press "B" on the elevator. The lab is located at the first door on the left as you exit the elevator.  We have sent the following medications to your pharmacy for you to pick up at your convenience: Prednisone taper - take 30 mg daily for 4 weeks, then 25 mg daily for 2 weeks, then 20 mg daily for 1 week, then 15 mg daily for 1 week.  Claiborne Billings, our nurse will contact you regarding Stelara.  Follow up with me on 04/07/20 at 10:40 am.   It was a pleasure to see you today!  Vito Cirigliano, D.O.

## 2020-03-05 ENCOUNTER — Other Ambulatory Visit: Payer: Self-pay | Admitting: Gastroenterology

## 2020-03-05 DIAGNOSIS — D509 Iron deficiency anemia, unspecified: Secondary | ICD-10-CM

## 2020-03-17 ENCOUNTER — Telehealth: Payer: Self-pay | Admitting: Gastroenterology

## 2020-03-17 LAB — ADALIMUMAB LEVEL FOR IBD: ADALIMUMAB LEVEL,IBD: <0.8 ug/mL

## 2020-03-17 NOTE — Telephone Encounter (Signed)
Pt is calling in regards to the [facility] which is dealing with her Stelara.

## 2020-03-18 ENCOUNTER — Other Ambulatory Visit: Payer: Self-pay | Admitting: Gastroenterology

## 2020-03-18 DIAGNOSIS — K51919 Ulcerative colitis, unspecified with unspecified complications: Secondary | ICD-10-CM

## 2020-03-18 NOTE — Telephone Encounter (Signed)
Spoke to patient who will come by our office on Friday 03/19/20 to fill out and sign her part for the [place] assistance program.

## 2020-03-22 ENCOUNTER — Telehealth: Payer: Self-pay | Admitting: Gastroenterology

## 2020-03-22 NOTE — Telephone Encounter (Signed)
Spoke to [place] from [place] to inform her that the Doctor portion of the forms have been signed and faxed over to them. Patient was planning to come by  the office last Friday,but was not feeling well. As soon as she comes by to sign her part we will fax them over as well.

## 2020-03-26 NOTE — Telephone Encounter (Signed)
Spoke to patient who reports that she was not able to get her portions for the [facility] assistance program to our office last week. She will have someone bring it by today so we can submit all the information. Patient voiced understanding.

## 2020-03-26 NOTE — Telephone Encounter (Signed)
Patient called to follow up on Stelara medication

## 2020-04-07 ENCOUNTER — Other Ambulatory Visit: Payer: Self-pay | Admitting: Family

## 2020-04-07 ENCOUNTER — Telehealth: Payer: Self-pay | Admitting: Gastroenterology

## 2020-04-07 ENCOUNTER — Ambulatory Visit: Payer: No Typology Code available for payment source | Admitting: Gastroenterology

## 2020-04-07 DIAGNOSIS — D649 Anemia, unspecified: Secondary | ICD-10-CM

## 2020-04-07 NOTE — Telephone Encounter (Signed)
Spoke to patientwho states that her daughter will come by the office today to drop off papers to fax to [place].

## 2020-04-07 NOTE — Telephone Encounter (Signed)
Pls call pt. She would like to speak with you about medications that you both have been speaking about.

## 2020-04-08 ENCOUNTER — Encounter: Payer: Self-pay | Admitting: Family

## 2020-04-08 ENCOUNTER — Other Ambulatory Visit: Payer: Self-pay | Admitting: Family

## 2020-04-08 ENCOUNTER — Inpatient Hospital Stay: Payer: PRIVATE HEALTH INSURANCE

## 2020-04-08 ENCOUNTER — Other Ambulatory Visit: Payer: Self-pay

## 2020-04-08 ENCOUNTER — Inpatient Hospital Stay: Payer: PRIVATE HEALTH INSURANCE | Attending: Family | Admitting: Family

## 2020-04-08 ENCOUNTER — Encounter: Payer: Self-pay | Admitting: General Practice

## 2020-04-08 ENCOUNTER — Telehealth: Payer: Self-pay | Admitting: Family

## 2020-04-08 VITALS — BP 115/69 | HR 90 | Temp 99.1°F | Resp 18 | Ht 67.0 in | Wt 179.0 lb

## 2020-04-08 DIAGNOSIS — K909 Intestinal malabsorption, unspecified: Secondary | ICD-10-CM | POA: Diagnosis not present

## 2020-04-08 DIAGNOSIS — E039 Hypothyroidism, unspecified: Secondary | ICD-10-CM | POA: Insufficient documentation

## 2020-04-08 DIAGNOSIS — K521 Toxic gastroenteritis and colitis: Secondary | ICD-10-CM

## 2020-04-08 DIAGNOSIS — D509 Iron deficiency anemia, unspecified: Secondary | ICD-10-CM

## 2020-04-08 DIAGNOSIS — K922 Gastrointestinal hemorrhage, unspecified: Secondary | ICD-10-CM | POA: Diagnosis not present

## 2020-04-08 DIAGNOSIS — D500 Iron deficiency anemia secondary to blood loss (chronic): Secondary | ICD-10-CM | POA: Insufficient documentation

## 2020-04-08 DIAGNOSIS — D649 Anemia, unspecified: Secondary | ICD-10-CM

## 2020-04-08 HISTORY — DX: Iron deficiency anemia, unspecified: D50.9

## 2020-04-08 LAB — CBC WITH DIFFERENTIAL (CANCER CENTER ONLY)
Abs Immature Granulocytes: 0.2 10*3/uL — ABNORMAL HIGH (ref 0.00–0.07)
Basophils Absolute: 0 10*3/uL (ref 0.0–0.1)
Basophils Relative: 0 %
Eosinophils Absolute: 0 10*3/uL (ref 0.0–0.5)
Eosinophils Relative: 0 %
HCT: 26.3 % — ABNORMAL LOW (ref 36.0–46.0)
Hemoglobin: 7.6 g/dL — ABNORMAL LOW (ref 12.0–15.0)
Immature Granulocytes: 2 %
Lymphocytes Relative: 19 %
Lymphs Abs: 1.6 10*3/uL (ref 0.7–4.0)
MCH: 25.1 pg — ABNORMAL LOW (ref 26.0–34.0)
MCHC: 28.9 g/dL — ABNORMAL LOW (ref 30.0–36.0)
MCV: 86.8 fL (ref 80.0–100.0)
Monocytes Absolute: 0.5 10*3/uL (ref 0.1–1.0)
Monocytes Relative: 6 %
Neutro Abs: 6.3 10*3/uL (ref 1.7–7.7)
Neutrophils Relative %: 73 %
Platelet Count: 556 10*3/uL — ABNORMAL HIGH (ref 150–400)
RBC: 3.03 MIL/uL — ABNORMAL LOW (ref 3.87–5.11)
RDW: 17.2 % — ABNORMAL HIGH (ref 11.5–15.5)
WBC Count: 8.7 10*3/uL (ref 4.0–10.5)
nRBC: 1.4 % — ABNORMAL HIGH (ref 0.0–0.2)

## 2020-04-08 LAB — IRON AND TIBC
Iron: 12 ug/dL — ABNORMAL LOW (ref 41–142)
Saturation Ratios: 4 % — ABNORMAL LOW (ref 21–57)
TIBC: 289 ug/dL (ref 236–444)
UIBC: 276 ug/dL (ref 120–384)

## 2020-04-08 LAB — CMP (CANCER CENTER ONLY)
ALT: 12 U/L (ref 0–44)
AST: 9 U/L — ABNORMAL LOW (ref 15–41)
Albumin: 3.3 g/dL — ABNORMAL LOW (ref 3.5–5.0)
Alkaline Phosphatase: 48 U/L (ref 38–126)
Anion gap: 8 (ref 5–15)
BUN: 13 mg/dL (ref 6–20)
CO2: 27 mmol/L (ref 22–32)
Calcium: 9.6 mg/dL (ref 8.9–10.3)
Chloride: 101 mmol/L (ref 98–111)
Creatinine: 0.92 mg/dL (ref 0.44–1.00)
GFR, Estimated: >60 mL/min
Glucose, Bld: 106 mg/dL — ABNORMAL HIGH (ref 70–99)
Potassium: 4.1 mmol/L (ref 3.5–5.1)
Sodium: 136 mmol/L (ref 135–145)
Total Bilirubin: 0.2 mg/dL — ABNORMAL LOW (ref 0.3–1.2)
Total Protein: 6.8 g/dL (ref 6.5–8.1)

## 2020-04-08 LAB — RETICULOCYTES
Immature Retic Fract: 33.8 % — ABNORMAL HIGH (ref 2.3–15.9)
RBC.: 3.05 MIL/uL — ABNORMAL LOW (ref 3.87–5.11)
Retic Count, Absolute: 83.9 10*3/uL (ref 19.0–186.0)
Retic Ct Pct: 2.8 % (ref 0.4–3.1)

## 2020-04-08 LAB — SAVE SMEAR(SSMR), FOR PROVIDER SLIDE REVIEW

## 2020-04-08 LAB — FERRITIN: Ferritin: 31 ng/mL (ref 11–307)

## 2020-04-08 LAB — LACTATE DEHYDROGENASE: LDH: 206 U/L — ABNORMAL HIGH (ref 98–192)

## 2020-04-08 NOTE — Progress Notes (Signed)
[place] Psychosocial Distress Screening Clinical Social Work  Clinical Social Work was referred by distress screening protocol.  The patient scored a 8 on the Psychosocial Distress Thermometer which indicates moderate distress. Clinical Social Worker contacted patient by phone to assess for distress and other psychosocial needs. Has colitis, has been in remission for 15 years, began again in January and has gotten progressively worse.  Has not been able to do "anything."  Accustomed to being a full time worker, not cannot do her job, "I was just tired."  Feels like her life is just sleeping and using bathroom.    Worked as a Copywriter, advertising throughout the community.  Experiencing complete change in role.  Has been caregiver for multiple family members, has two children of her own and has brought in other children to care for them.    Wants to be able to "do something fun" with her husband who is newly retired.  Had hoped to be able to travel, participate in various activities.  This is now difficult due to symptoms.  Discussed balance between acceptance and change when dealing with chronic and life limiting illness.  Discussed having curiosity about what works/brings greater symptom relief vs what makes life more difficult.  Discussed value of support and connection w others who are also dealing with colitis - provided information on Crohns and [place] [email or web address].  She will start iron infusions tomorrow and is hopeful this will bring some relief from fatigue.  She is grateful that she has a plan for upcoming treatments at [facility].  ONCBCN DISTRESS SCREENING 04/08/2020  Screening Type Initial Screening  Distress experienced in past week (1-10) 8  Practical problem type Insurance;Work/school  Emotional problem type Feeling hopeless;Adjusting to appearance changes  Spiritual/Religous concerns type Loss of sense of purpose  Physical Problem type  Pain;Nausea/vomiting;Sleep/insomnia;Getting around;Breathing;Constipation/diarrhea  Physician notified of physical symptoms Yes  Other Judson Roch [facility] addressed issus. Patient knows how to access addtional resources if needed     Clinical Social Worker follow up needed: No.  She is welcome to contact me as needed in the future for support/resources.   If yes, follow up plan:  Beverely Pace, [place], LCSW Clinical Social Worker Phone:  [phone]

## 2020-04-08 NOTE — Progress Notes (Signed)
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  She has been in a flare since January and she states that she has 12-15 watery bright red bloody stools a day. She notes incontinence at times.  She is symptomatic with fatigue, weakness, SOB with minimal exertion, dizziness, chills, palpitations, easy bruising on arms and legs, puffiness in her ankles, numbness and tingling in her fingers and feet at times.  She is currently on an oral iron supplement but has not noted any difference inher symptoms.  Hgb is down to 7.5, MCV 86 and platelets 556.  She had a colonoscopy in March of this year which showed moderately active left sided UC. She also had a polyp removed from the cecum as well as pseudopolyps removed from the sigmoid colon all benign.  She is waiting on insurance approval for Stelara to treat the UC. She is steroid dependent at this time taking 40 mg PO daily.  She is anxious over her health and waiting for approval for treatment. No thoughts of harming herself or others.  Patient is not in distress at this time.  She has abdominal pain associated with the ulcerative colitis.  No fever, n/v, cough, rash, chest pain or changes in bladder habits.  No personal history of cancer. He brother had prostate cancer and her maternal grandmother had breast cancer.  She is due again for mammogram later this month.  She has generalized joint aches and pains that wax and wane.  No falls or syncopal episodes to report.  She states that her  appetite is limited to what she can tolerated. She is doing her best to stay well hydrated but stays thirsty.  No smoking, ETOH or recreational drug use.  She is doing resistance band workouts to try and increase her muscle tone.  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   ROS: All other 10 point review of systems is negative.   PAST MEDICAL HISTORY:   Past Medical History:  Diagnosis Date  . Acute cystitis with hematuria 05/09/2016  . ADD (attention deficit disorder)   . Atrophic vaginitis 05/09/2016  . Degenerative joint disease   . Easy bruisability 05/09/2016  . Goiter   . H/O degenerative disc disease   . Hypertension   . Hypothyroidism   . Increased frequency of urination 05/09/2016  . Leg swelling   . Ulcerative colitis ([place])   . Varicose veins   . Women's annual routine gynecological examination 05/09/2016    ALLERGIES: No Known Allergies    MEDICATIONS:  Current Outpatient Medications on File Prior to Visit  Medication Sig Dispense Refill  . ALPRAZolam (XANAX) 0.5 MG tablet Take 0.5 mg by mouth 2 (two) times daily as needed for sleep.    . Ascorbic Acid (C-500) 500 MG CHEW Chew 500 mg by mouth daily.    . B Complex Vitamins (VITAMIN B COMPLEX PO) Take 1 capsule by mouth daily.    Marland Kitchen buPROPion (WELLBUTRIN XL) 300  MG 24 hr tablet Take 300 mg by mouth daily.     . Calcium Citrate-Vitamin D (CALCIUM + D PO) Take 1 tablet by mouth 2 (two) times daily.    . Coenzyme Q10 (CO Q10 PO) Take 1 tablet by mouth daily.    Marland Kitchen dicyclomine (BENTYL) 10 MG capsule Take 2 capsules (20 mg total) by mouth every 6 (six) hours as needed for spasms. 60 capsule 3  . ferrous sulfate 325 (65 FE) MG tablet Take 325 mg by mouth daily with breakfast.    . GLUCOSAMINE-CHONDROITIN PO Take 1 tablet by mouth daily.    Marland Kitchen HYDROcodone-acetaminophen (NORCO/VICODIN) 5-325 MG tablet Take 1 tablet by mouth every 6 (six) hours as needed for  moderate pain. 18 tablet 0  . levothyroxine (SYNTHROID, LEVOTHROID) 175 MCG tablet Take 200 mcg by mouth daily.     Marland Kitchen lisdexamfetamine (VYVANSE) 60 MG capsule Take 60 mg by mouth every morning.    Marland Kitchen lisinopril (PRINIVIL,ZESTRIL) 10 MG tablet Take 10 mg by mouth daily.    . mesalamine (APRISO) 0.375 g 24 hr capsule TAKE 4 CAPSULES BY MOUTH EVERY DAY IN THE MORNING 120 capsule 3  . mesalamine (CANASA) 1000 MG suppository Place 1 suppository (1,000 mg total) rectally at bedtime. 30 suppository 3  . Multiple Vitamin (MULTIVITAMIN) tablet Take 1 tablet by mouth daily.    . predniSONE (DELTASONE) 20 MG tablet TAKE 2 TABLETS BY MOUTH FOR 14 DAYS THEN 1 & 1/2 TABLET FOR 7 DAYS THEN 1 TABLET DAILY (Patient taking differently: Take 10 mg by mouth daily. ) 48 tablet 0  . predniSONE (DELTASONE) 5 MG tablet Prednisone taper: Take 30 mg daily x 4 weeks Take 25 mg daily x 2 weeks Take 20 mg daily x 1 week Take 15 mg daily x 1 week 274 tablet 0  . TURMERIC PO Take 1 tablet by mouth daily.     Current Facility-Administered Medications on File Prior to Visit  Medication Dose Route Frequency Provider Last Rate Last Admin  . 0.9 %  sodium chloride infusion  500 mL Intravenous Once Cirigliano, [place], DO         PAST SURGICAL HISTORY Past Surgical History:  Procedure Laterality Date  . CESAREAN SECTION  [id number]  . TOTAL THYROIDECTOMY  2008    FAMILY HISTORY: Family History  Problem Relation Age of Onset  . COPD Mother   . Emphysema Mother   . Testicular cancer Brother   . Breast cancer Maternal Grandmother   . Heart Problems Maternal Grandfather     SOCIAL HISTORY:  reports that she has never smoked. She has never used smokeless tobacco. She reports current alcohol use. She reports that she does not use drugs.  PERFORMANCE STATUS: The patient's performance status is 1 - Symptomatic but completely ambulatory  PHYSICAL EXAM: Most Recent Vital Signs: There were no vitals taken for this  visit. BP 115/69 (BP Location: Right Arm, Patient Position: Sitting)   Pulse 90   Temp 99.1 F (37.3 C) (Oral)   Resp 18   Ht 5' 7"  (1.702 m)   Wt 179 lb (81.2 kg)   SpO2 100%   BMI 28.04 kg/m   General Appearance:    Alert, cooperative, no distress, appears stated age  Head:    Normocephalic, without obvious abnormality, atraumatic  Eyes:    PERRL, conjunctiva/corneas clear, EOM's intact, fundi    benign, both eyes  Ears:    Normal TM's and external ear canals, both ears  Nose:  Nares normal, septum midline, mucosa normal, no drainage    or sinus tenderness  Throat:   Lips, mucosa, and tongue normal; teeth and gums normal  Neck:   Supple, symmetrical, trachea midline, no adenopathy;    thyroid:  no enlargement/tenderness/nodules; no carotid   bruit or JVD  Back:     Symmetric, no curvature, ROM normal, no CVA tenderness  Lungs:     Clear to auscultation bilaterally, respirations unlabored  Chest Wall:    No tenderness or deformity   Heart:    Regular rate and rhythm, S1 and S2 normal, no murmur, rub   or gallop  Breast Exam:    No tenderness, masses, or nipple abnormality  Abdomen:     Soft, non-tender, bowel sounds active all four quadrants,    no masses, no organomegaly  Genitalia:    Normal female without lesion, discharge or tenderness  Rectal:    Normal tone, normal prostate, no masses or tenderness;   guaiac negative stool  Extremities:   Extremities normal, atraumatic, no cyanosis or edema  Pulses:   2+ and symmetric all extremities  Skin:   Skin color, texture, turgor normal, no rashes or lesions  Lymph nodes:   Cervical, supraclavicular, and axillary nodes normal  Neurologic:   CNII-XII intact, normal strength, sensation and reflexes    throughout    LABORATORY DATA:  Results for orders placed or performed in visit on 04/08/20 (from the past 48 hour(s))  Save Smear (SSMR)     Status: None   Collection Time: 04/08/20  8:37 AM  Result Value Ref Range   Smear  Review SMEAR STAINED AND AVAILABLE FOR REVIEW     Comment: Performed at [facility] Lab at [facility], [address], [place], [place] [address]  CBC with Differential (Cancer Center Only)     Status: Abnormal   Collection Time: 04/08/20  8:37 AM  Result Value Ref Range   WBC Count 8.7 4.0 - 10.5 K/uL   RBC 3.03 (L) 3.87 - 5.11 MIL/uL   Hemoglobin 7.6 (L) 12.0 - 15.0 g/dL   HCT 26.3 (L) 36 - 46 %   MCV 86.8 80.0 - 100.0 fL   MCH 25.1 (L) 26.0 - 34.0 pg   MCHC 28.9 (L) 30.0 - 36.0 g/dL   RDW 17.2 (H) 11.5 - 15.5 %   Platelet Count 556 (H) 150 - 400 K/uL   nRBC 1.4 (H) 0.0 - 0.2 %   Neutrophils Relative % 73 %   Neutro Abs 6.3 1.7 - 7.7 K/uL   Lymphocytes Relative 19 %   Lymphs Abs 1.6 0.7 - 4.0 K/uL   Monocytes Relative 6 %   Monocytes Absolute 0.5 0.1 - 1.0 K/uL   Eosinophils Relative 0 %   Eosinophils Absolute 0.0 0.0 - 0.5 K/uL   Basophils Relative 0 %   Basophils Absolute 0.0 0.0 - 0.1 K/uL   Immature Granulocytes 2 %   Abs Immature Granulocytes 0.20 (H) 0.00 - 0.07 K/uL    Comment: Performed at [facility] Lab at [facility], [address] [place], [place] [address]  Reticulocytes     Status: Abnormal   Collection Time: 04/08/20  8:38 AM  Result Value Ref Range   Retic Ct Pct 2.8 0.4 - 3.1 %   RBC. 3.05 (L) 3.87 - 5.11 MIL/uL   Retic Count, Absolute 83.9 19.0 - 186.0 K/uL   Immature Retic Fract 33.8 (H) 2.3 -  15.9 %    Comment: Performed at [facility] Lab at [facility], [address], [place], [place] [address]      RADIOGRAPHY: No results found.     PATHOLOGY: None   ASSESSMENT/PLAN: Ms. Hogate is a very pleasant 59 yo caucasian female with iron deficiency anemia secondary to GI blood loss with ulcerative colitis.  She has been an flare for 11 months now and is waiting for her insurance to approve Stelara. Hopefully this will come through soon.  We will see what her iron studies look  like. Waiting on PA with insurance for IV iron. We will try and get her started tomorrow.  Follow-up with lab in 1 month.   All questions were answered and she is in agreement with the plan. She can contact our office with any questions or concerns. We can certainly see her sooner if needed.    Laverna Peace, NP

## 2020-04-08 NOTE — Telephone Encounter (Signed)
Appointments scheduled calendar printed & mailed per 11/4 los

## 2020-04-08 NOTE — Progress Notes (Signed)
Patient stable at discharge.

## 2020-04-09 ENCOUNTER — Inpatient Hospital Stay: Payer: PRIVATE HEALTH INSURANCE

## 2020-04-09 ENCOUNTER — Other Ambulatory Visit: Payer: Self-pay | Admitting: Family

## 2020-04-09 VITALS — BP 118/53 | HR 90 | Temp 98.3°F | Resp 20

## 2020-04-09 DIAGNOSIS — D500 Iron deficiency anemia secondary to blood loss (chronic): Secondary | ICD-10-CM

## 2020-04-09 LAB — ERYTHROPOIETIN: Erythropoietin: 55.4 m[IU]/mL — ABNORMAL HIGH (ref 2.6–18.5)

## 2020-04-09 MED ORDER — SODIUM CHLORIDE 0.9 % IV SOLN
200.0000 mg | Freq: Once | INTRAVENOUS | Status: AC
  Administered 2020-04-09: 200 mg via INTRAVENOUS
  Filled 2020-04-09: qty 200

## 2020-04-09 MED ORDER — SODIUM CHLORIDE 0.9 % IV SOLN
Freq: Once | INTRAVENOUS | Status: AC
  Filled 2020-04-09: qty 250

## 2020-04-09 NOTE — Progress Notes (Signed)
Pt discharged in no apparent distress. Pt left ambulatory without assistance. Pt aware of discharge instructions and verbalized understanding and had no further questions.  

## 2020-04-09 NOTE — Patient Instructions (Signed)

## 2020-04-14 ENCOUNTER — Other Ambulatory Visit: Payer: Self-pay

## 2020-04-14 ENCOUNTER — Inpatient Hospital Stay: Payer: PRIVATE HEALTH INSURANCE

## 2020-04-14 VITALS — BP 111/67 | HR 69 | Temp 98.3°F | Resp 17

## 2020-04-14 DIAGNOSIS — D500 Iron deficiency anemia secondary to blood loss (chronic): Secondary | ICD-10-CM

## 2020-04-14 MED ORDER — SODIUM CHLORIDE 0.9 % IV SOLN
Freq: Once | INTRAVENOUS | Status: AC
  Filled 2020-04-14: qty 250

## 2020-04-14 MED ORDER — SODIUM CHLORIDE 0.9 % IV SOLN
200.0000 mg | Freq: Once | INTRAVENOUS | Status: AC
  Administered 2020-04-14: 200 mg via INTRAVENOUS
  Filled 2020-04-14: qty 200

## 2020-04-14 NOTE — Telephone Encounter (Signed)
Spoke to patient who states that she needs to come by the office today with another tax form to submit to [facility] infusion for assistance on Stelara. Patient has an iron infusion at 1:30 today across the hall and will stop by before.

## 2020-04-14 NOTE — Patient Instructions (Signed)

## 2020-04-14 NOTE — Progress Notes (Signed)
Pt discharged in no apparent distress. Pt left ambulatory without assistance. Pt aware of discharge instructions and verbalized understanding and had no further questions.  

## 2020-04-14 NOTE — Telephone Encounter (Signed)
Pt is requesting a call back from a nurse to discuss the [facility] medication./

## 2020-04-16 ENCOUNTER — Inpatient Hospital Stay: Payer: PRIVATE HEALTH INSURANCE

## 2020-04-16 ENCOUNTER — Other Ambulatory Visit: Payer: Self-pay

## 2020-04-16 VITALS — BP 131/73 | HR 68 | Resp 17

## 2020-04-16 DIAGNOSIS — D500 Iron deficiency anemia secondary to blood loss (chronic): Secondary | ICD-10-CM | POA: Diagnosis not present

## 2020-04-16 DIAGNOSIS — R110 Nausea: Secondary | ICD-10-CM

## 2020-04-16 MED ORDER — LORAZEPAM 1 MG PO TABS
ORAL_TABLET | ORAL | Status: AC
  Filled 2020-04-16: qty 1

## 2020-04-16 MED ORDER — LORAZEPAM 1 MG PO TABS
0.5000 mg | ORAL_TABLET | Freq: Once | ORAL | Status: AC
  Administered 2020-04-16: 0.5 mg via SUBLINGUAL

## 2020-04-16 MED ORDER — SODIUM CHLORIDE 0.9 % IV SOLN
Freq: Once | INTRAVENOUS | Status: AC
  Filled 2020-04-16: qty 250

## 2020-04-16 MED ORDER — SODIUM CHLORIDE 0.9 % IV SOLN
200.0000 mg | Freq: Once | INTRAVENOUS | Status: AC
  Administered 2020-04-16: 200 mg via INTRAVENOUS
  Filled 2020-04-16: qty 200

## 2020-04-16 NOTE — Patient Instructions (Signed)

## 2020-04-16 NOTE — Progress Notes (Signed)
Patient came in for iron infusion today stating she started feeling nauseated about 45 min prior to coming in. States she tripped and fell yesterday (states it was an accident, did not get dizzy) and hit her chin and left side of her body, did not go to ED. States she did not lose consciousness or have any neuro issues such as blurred vision or dizziness. Verbal order received from [place] for ativan 0.45m sublingual.    Patient left post iron infusion ambulatory with no issues, states her nausea has resolved and has no other concerns or complaints. She knows to call if she has any other concerns or questions.

## 2020-04-19 ENCOUNTER — Other Ambulatory Visit: Payer: Self-pay

## 2020-04-19 ENCOUNTER — Inpatient Hospital Stay: Payer: PRIVATE HEALTH INSURANCE

## 2020-04-19 ENCOUNTER — Telehealth: Payer: Self-pay | Admitting: Gastroenterology

## 2020-04-19 VITALS — BP 132/74 | HR 79 | Temp 98.1°F | Resp 17

## 2020-04-19 DIAGNOSIS — D500 Iron deficiency anemia secondary to blood loss (chronic): Secondary | ICD-10-CM | POA: Diagnosis not present

## 2020-04-19 MED ORDER — SODIUM CHLORIDE 0.9 % IV SOLN
200.0000 mg | Freq: Once | INTRAVENOUS | Status: AC
  Administered 2020-04-19: 200 mg via INTRAVENOUS
  Filled 2020-04-19: qty 200

## 2020-04-19 MED ORDER — SODIUM CHLORIDE 0.9 % IV SOLN
Freq: Once | INTRAVENOUS | Status: AC
  Filled 2020-04-19: qty 250

## 2020-04-19 NOTE — Telephone Encounter (Signed)
Please call Juanita Craver from [facility].  She needs to clarify some information about patient getting Stelara subcut injections since it was approved. Also product order request needs to be resigned by MD, last one is expired.

## 2020-04-19 NOTE — Telephone Encounter (Signed)
Received form from [facility] infusion. Will have Dr Bryan Lemma sign and fax back

## 2020-04-19 NOTE — Progress Notes (Signed)
Pt discharged in no apparent distress. Pt left ambulatory without assistance. Pt aware of discharge instructions and verbalized understanding and had no further questions.  

## 2020-04-20 NOTE — Telephone Encounter (Signed)
Pt is checking on the status of the Stelara subcut injections.

## 2020-04-20 NOTE — Telephone Encounter (Signed)
Spoke to patient to inform her that the paperwork is on Dr De Hollingshead desk for him to sign today. They will be faxed over at that time patient voiced understanding.

## 2020-04-21 ENCOUNTER — Other Ambulatory Visit: Payer: Self-pay

## 2020-04-21 ENCOUNTER — Inpatient Hospital Stay: Payer: PRIVATE HEALTH INSURANCE

## 2020-04-21 VITALS — BP 128/81 | HR 77 | Temp 98.5°F | Resp 17

## 2020-04-21 DIAGNOSIS — D500 Iron deficiency anemia secondary to blood loss (chronic): Secondary | ICD-10-CM | POA: Diagnosis not present

## 2020-04-21 MED ORDER — SODIUM CHLORIDE 0.9 % IV SOLN
Freq: Once | INTRAVENOUS | Status: AC
  Filled 2020-04-21: qty 250

## 2020-04-21 MED ORDER — SODIUM CHLORIDE 0.9 % IV SOLN
200.0000 mg | Freq: Once | INTRAVENOUS | Status: AC
  Administered 2020-04-21: 200 mg via INTRAVENOUS
  Filled 2020-04-21: qty 200

## 2020-04-21 NOTE — Progress Notes (Signed)
Pt discharged in no apparent distress. Pt left ambulatory without assistance. Pt aware of discharge instructions and verbalized understanding and had no further questions.  

## 2020-04-26 ENCOUNTER — Telehealth: Payer: Self-pay | Admitting: Gastroenterology

## 2020-04-26 NOTE — Telephone Encounter (Signed)
Is this the initial Stelara dose? She was previously at 30 mg/day with plan for slow taper until starting Stelara. Has she moved back up to 40 mg/day in the interim (I dont see notes reflecting this)?

## 2020-04-26 NOTE — Telephone Encounter (Signed)
It will be her initial Stelara dose. She reports 40 mg daily over the past 2 weeks. She said it was the only way she could cope with her symptoms. She took it upon herself to take the 40 mg daily as her symptoms were coming back while tapering.

## 2020-04-26 NOTE — Telephone Encounter (Signed)
Spoke to patient who would like a few more days on Prednisone 40 mg until her infusion next week.

## 2020-04-26 NOTE — Telephone Encounter (Signed)
Understood. Rachel with staying on 40 mg/day until Stelara induction, then will plan to start slow taper again, decreasing by 5 mg each week. Please provide Rx for Prednisone to reflect this change. Thanks!

## 2020-04-27 ENCOUNTER — Other Ambulatory Visit: Payer: Self-pay | Admitting: Gastroenterology

## 2020-04-27 MED ORDER — PREDNISONE 10 MG PO TABS: ORAL_TABLET | ORAL | 0 refills | Status: AC

## 2020-04-27 NOTE — Telephone Encounter (Signed)
Medication sent to pharmacy. Unable to leave a voicemail meaasge on machine as the mailbox is full [facility] will contact patient once the prescription is ready.

## 2020-04-28 NOTE — Telephone Encounter (Signed)
Juanita Craver from [facility] is wanting to update the nurse about the pt's Stelara injections. Caller states Wynetta Emery and [employer]etta Emery will be faxing over a form to be completed in regards to this sometime soon.

## 2020-05-01 ENCOUNTER — Other Ambulatory Visit: Payer: Self-pay | Admitting: Gastroenterology

## 2020-05-03 NOTE — Telephone Encounter (Signed)
Spoke to [name] from [facility] who stated that she spoke with [employer] financial assistance  program who stated that they overlooked the Stelara injection form that was faxed over,and only followed through with the Infusion portion. Therefor the Stelara every 8 week prescription,and financial part has expired. They faxed the forms that need to be completed by the patient with financial information. Dr Bryan Lemma will sign his part. Patient will come by the office this week to complete the process. The forms will be faxed st that time.

## 2020-05-04 NOTE — Telephone Encounter (Signed)
Spoke to [name] from [place]. She said patient does not need to submit further financial information to [employer] patient assistance. Her Stelara infusion should be delivered to [facility] by 12/1/521. They will contact patient with a date and time for her infusion.

## 2020-05-04 NOTE — Telephone Encounter (Signed)
Kristina Gentry from [facility] is requesting a call back from [place] to discuss an update she has for her.  [phone] [id number] [id number]

## 2020-05-06 ENCOUNTER — Encounter: Payer: Self-pay | Admitting: Family

## 2020-05-06 ENCOUNTER — Inpatient Hospital Stay (HOSPITAL_BASED_OUTPATIENT_CLINIC_OR_DEPARTMENT_OTHER): Payer: PRIVATE HEALTH INSURANCE | Admitting: Family

## 2020-05-06 ENCOUNTER — Telehealth: Payer: Self-pay | Admitting: Family

## 2020-05-06 ENCOUNTER — Inpatient Hospital Stay: Payer: PRIVATE HEALTH INSURANCE | Attending: Family

## 2020-05-06 ENCOUNTER — Inpatient Hospital Stay: Payer: PRIVATE HEALTH INSURANCE

## 2020-05-06 ENCOUNTER — Other Ambulatory Visit: Payer: Self-pay

## 2020-05-06 VITALS — BP 121/71 | HR 85 | Temp 98.0°F | Resp 17 | Wt 181.0 lb

## 2020-05-06 DIAGNOSIS — K922 Gastrointestinal hemorrhage, unspecified: Secondary | ICD-10-CM | POA: Diagnosis not present

## 2020-05-06 DIAGNOSIS — D500 Iron deficiency anemia secondary to blood loss (chronic): Secondary | ICD-10-CM | POA: Insufficient documentation

## 2020-05-06 LAB — CBC WITH DIFFERENTIAL (CANCER CENTER ONLY)
Abs Immature Granulocytes: 0.26 10*3/uL — ABNORMAL HIGH (ref 0.00–0.07)
Basophils Absolute: 0 10*3/uL (ref 0.0–0.1)
Basophils Relative: 0 %
Eosinophils Absolute: 0 10*3/uL (ref 0.0–0.5)
Eosinophils Relative: 0 %
HCT: 30.4 % — ABNORMAL LOW (ref 36.0–46.0)
Hemoglobin: 8.9 g/dL — ABNORMAL LOW (ref 12.0–15.0)
Immature Granulocytes: 4 %
Lymphocytes Relative: 8 %
Lymphs Abs: 0.6 10*3/uL — ABNORMAL LOW (ref 0.7–4.0)
MCH: 27.5 pg (ref 26.0–34.0)
MCHC: 29.3 g/dL — ABNORMAL LOW (ref 30.0–36.0)
MCV: 93.8 fL (ref 80.0–100.0)
Monocytes Absolute: 0.3 10*3/uL (ref 0.1–1.0)
Monocytes Relative: 5 %
Neutro Abs: 6.3 10*3/uL (ref 1.7–7.7)
Neutrophils Relative %: 83 %
Platelet Count: 325 10*3/uL (ref 150–400)
RBC: 3.24 MIL/uL — ABNORMAL LOW (ref 3.87–5.11)
RDW: 22.4 % — ABNORMAL HIGH (ref 11.5–15.5)
WBC Count: 7.5 10*3/uL (ref 4.0–10.5)
nRBC: 0.4 % — ABNORMAL HIGH (ref 0.0–0.2)

## 2020-05-06 LAB — RETICULOCYTES
Immature Retic Fract: 31.6 % — ABNORMAL HIGH (ref 2.3–15.9)
RBC.: 3.24 MIL/uL — ABNORMAL LOW (ref 3.87–5.11)
Retic Count, Absolute: 93.3 10*3/uL (ref 19.0–186.0)
Retic Ct Pct: 2.9 % (ref 0.4–3.1)

## 2020-05-06 LAB — IRON AND TIBC
Iron: 27 ug/dL — ABNORMAL LOW (ref 41–142)
Saturation Ratios: 11 % — ABNORMAL LOW (ref 21–57)
TIBC: 239 ug/dL (ref 236–444)
UIBC: 212 ug/dL (ref 120–384)

## 2020-05-06 LAB — FERRITIN: Ferritin: 192 ng/mL (ref 11–307)

## 2020-05-06 NOTE — Progress Notes (Signed)
Hematology and Oncology Follow Up Visit  Kristina Gentry [id number] 1960/11/17 59 y.o. 05/06/2020   Principle Diagnosis:  Iron deficiency anemia secondary to intermittent GI blood loss and malabsorption with ulcerative colitis  Current Therapy:   IV iron as indicated    Interim History:  Kristina Gentry is here today for follow-up. She is doing well and can tell that her energy has improved. She still has episodes of fatigue as well as SOB with over exertion. She takes breaks to rest as needed.  She is still waiting to start Stelara. It sounds like this may be on backorder.  She states that her bowel movements are down to 8-10 a day. She still notes blood in her stool.  No abnormal bruising, no petechiae.  No fever, chills, n/v, cough, rash, dizziness, chest pain, palpitations, abdominal pain or changes in bowel or bladder habits.  No swelling, tenderness, numbness or tingling in her extremities sat this time.  She notes occasional puffiness in her left foot and ankle. This comes and goes.  She stumbled and fell over a stool in her home recently but thankfully was not seriously injured.  No syncope.  She has maintained a good appetite and is staying well hydrated. Her weight is stable.   ECOG Performance Status: 1 - Symptomatic but completely ambulatory  Medications:  Allergies as of 05/06/2020   No Known Allergies     Medication List       Accurate as of May 06, 2020  9:38 AM. If you have any questions, ask your nurse or doctor.        ALPRAZolam 0.5 MG tablet Commonly known as: XANAX Take 0.5 mg by mouth 2 (two) times daily as needed for sleep.   buPROPion 300 MG 24 hr tablet Commonly known as: WELLBUTRIN XL Take 300 mg by mouth daily.   C-500 500 MG Chew Generic drug: Ascorbic Acid Chew 500 mg by mouth daily.   CALCIUM + D PO Take 1 tablet by mouth 2 (two) times daily.   CO Q10 PO Take 1 tablet by mouth daily.   dicyclomine 10 MG capsule Commonly known as:  Bentyl Take 2 capsules (20 mg total) by mouth every 6 (six) hours as needed for spasms.   ELDERBERRY PO Take by mouth.   ferrous sulfate 325 (65 FE) MG tablet Take 325 mg by mouth daily with breakfast.   GLUCOSAMINE-CHONDROITIN PO Take 1 tablet by mouth daily.   HYDROcodone-acetaminophen 5-325 MG tablet Commonly known as: NORCO/VICODIN Take 1 tablet by mouth every 6 (six) hours as needed for moderate pain.   levothyroxine 200 MCG tablet Commonly known as: SYNTHROID Take 200 mcg by mouth daily.   levothyroxine 175 MCG tablet Commonly known as: SYNTHROID Take 200 mcg by mouth daily.   lisdexamfetamine 60 MG capsule Commonly known as: VYVANSE Take 60 mg by mouth every morning.   lisinopril 10 MG tablet Commonly known as: ZESTRIL Take 10 mg by mouth daily.   mesalamine 1000 MG suppository Commonly known as: CANASA Place 1 suppository (1,000 mg total) rectally at bedtime.   mesalamine 0.375 g 24 hr capsule Commonly known as: APRISO TAKE 4 CAPSULES BY MOUTH EVERY DAY IN THE MORNING   multivitamin tablet Take 1 tablet by mouth daily.   nitrofurantoin (macrocrystal-monohydrate) 100 MG capsule Commonly known as: MACROBID Take 100 mg by mouth 2 (two) times daily.   predniSONE 20 MG tablet Commonly known as: DELTASONE Take 40 mg by mouth daily with breakfast.   predniSONE  10 MG tablet Commonly known as: DELTASONE Take 4 tablets (40 mg total) by mouth daily with breakfast for 7 days, THEN 3.5 tablets (35 mg total) daily with breakfast for 7 days, THEN 3 tablets (30 mg total) daily with breakfast for 7 days, THEN 2.5 tablets (25 mg total) daily with breakfast for 7 days, THEN 2 tablets (20 mg total) daily with breakfast for 7 days, THEN 1.5 tablets (15 mg total) daily with breakfast for 7 days, THEN 1 tablet (10 mg total) daily with breakfast for 7 days, THEN 0.5 tablets (5 mg total) daily with breakfast for 7 days. Start taking on: April 27, 2020   TURMERIC PO Take 1  tablet by mouth daily.   VITAMIN B COMPLEX PO Take 1 capsule by mouth daily.   Vitamin D (Ergocalciferol) 1.25 MG (50000 UNIT) Caps capsule Commonly known as: DRISDOL Take 50,000 Units by mouth once a week.       Allergies: No Known Allergies  Past Medical History, Surgical history, Social history, and Family History were reviewed and updated.  Review of Systems: All other 10 point review of systems is negative.   Physical Exam:  weight is 181 lb (82.1 kg). Her oral temperature is 98 F (36.7 C). Her blood pressure is 121/71 and her pulse is 85. Her respiration is 17 and oxygen saturation is 100%.   Wt Readings from Last 3 Encounters:  05/06/20 181 lb (82.1 kg)  04/08/20 179 lb (81.2 kg)  03/04/20 178 lb 8 oz (81 kg)    Ocular: Sclerae unicteric, pupils equal, round and reactive to light Ear-nose-throat: Oropharynx clear, dentition fair Lymphatic: No cervical or supraclavicular adenopathy Lungs no rales or rhonchi, good excursion bilaterally Heart regular rate and rhythm, no murmur appreciated Abd soft, nontender, positive bowel sounds MSK no focal spinal tenderness, no joint edema Neuro: non-focal, well-oriented, appropriate affect Breasts: Deferred   Lab Results  Component Value Date   WBC 7.5 05/06/2020   HGB 8.9 (L) 05/06/2020   HCT 30.4 (L) 05/06/2020   MCV 93.8 05/06/2020   PLT 325 05/06/2020   Lab Results  Component Value Date   FERRITIN 31 04/08/2020   IRON 12 (L) 04/08/2020   TIBC 289 04/08/2020   UIBC 276 04/08/2020   IRONPCTSAT 4 (L) 04/08/2020   Lab Results  Component Value Date   RETICCTPCT 2.9 05/06/2020   RBC 3.24 (L) 05/06/2020   RBC 3.24 (L) 05/06/2020   No results found for: KPAFRELGTCHN, LAMBDASER, KAPLAMBRATIO No results found for: IGGSERUM, IGA, IGMSERUM No results found for: Odetta Pink, SPEI   Chemistry      Component Value Date/Time   NA 136 04/08/2020 0837   K 4.1  04/08/2020 0837   CL 101 04/08/2020 0837   CO2 27 04/08/2020 0837   BUN 13 04/08/2020 0837   CREATININE 0.92 04/08/2020 0837      Component Value Date/Time   CALCIUM 9.6 04/08/2020 0837   ALKPHOS 48 04/08/2020 0837   AST 9 (L) 04/08/2020 0837   ALT 12 04/08/2020 0837   BILITOT 0.2 (L) 04/08/2020 0837       Impression and Plan: Kristina Gentry is a very pleasant 59 yo caucasian female with iron deficiency anemia secondary to GI blood loss with ulcerative colitis.  Iron studies today are pending. We will get her set up for more replacement if needed.  Follow-up in 6 weeks.  She was encouraged to contact our office with any questions or concerns.  We can certainly see her sooner if needed.   Kristina Peace, NP 12/2/20219:38 AM

## 2020-05-06 NOTE — Telephone Encounter (Signed)
Appointments scheduled patient has My Chart Access per 12/2 los

## 2020-05-10 ENCOUNTER — Telehealth: Payer: Self-pay | Admitting: Family

## 2020-05-10 NOTE — Telephone Encounter (Signed)
Called and spoke with patient regarding appointments added per 12/3 sch msg

## 2020-05-17 ENCOUNTER — Inpatient Hospital Stay: Payer: PRIVATE HEALTH INSURANCE

## 2020-05-17 ENCOUNTER — Other Ambulatory Visit: Payer: Self-pay

## 2020-05-17 VITALS — BP 119/74 | HR 85 | Temp 98.5°F | Resp 17

## 2020-05-17 DIAGNOSIS — D500 Iron deficiency anemia secondary to blood loss (chronic): Secondary | ICD-10-CM | POA: Diagnosis not present

## 2020-05-17 MED ORDER — SODIUM CHLORIDE 0.9 % IV SOLN
200.0000 mg | Freq: Once | INTRAVENOUS | Status: AC
  Administered 2020-05-17: 200 mg via INTRAVENOUS
  Filled 2020-05-17: qty 200

## 2020-05-17 MED ORDER — SODIUM CHLORIDE 0.9 % IV SOLN
Freq: Once | INTRAVENOUS | Status: AC
  Filled 2020-05-17: qty 250

## 2020-05-17 NOTE — Progress Notes (Signed)
Pt discharged in no apparent distress. Pt left ambulatory without assistance. Pt aware of discharge instructions and verbalized understanding and had no further questions.  

## 2020-05-17 NOTE — Patient Instructions (Signed)

## 2020-05-18 ENCOUNTER — Telehealth: Payer: Self-pay | Admitting: Gastroenterology

## 2020-05-18 NOTE — Telephone Encounter (Signed)
Estill Cotta NP from [facility] is requesting a call back from a nurse to discuss the pt's Stelara.  CB [phone] Ext [phone]

## 2020-05-19 ENCOUNTER — Other Ambulatory Visit: Payer: Self-pay | Admitting: Gastroenterology

## 2020-05-19 MED ORDER — EPINEPHRINE 0.3 MG/0.3ML IJ SOAJ: 0.3000 mg | INTRAMUSCULAR | 1 refills | Status: DC | PRN

## 2020-05-19 NOTE — Telephone Encounter (Signed)
Left message on nurse line for a call back. [facility] sent a report on the initial Stelara 390 mg infusion. Patient reported chest pain and redness post infusion. She received  250 ml NS bolus along with Benadryl 25 mg IV. Her blood pressure was elevated with systolic being as high as 170. She does not remember the diasostic number. Her symptoms resolved within 20 minutes. I spoke with her this morning. She feels better,but has a bad headache. She is taking Tylenol with some relief. Patient has a blood pressure cuff at home and is monitoring.   She will start SQ injections in 8 weeks and would like to have an epi pen available incase she has another reaction. She she has PO benadryl as well. Patient will keep [place] informed on how she is feeling.

## 2020-05-19 NOTE — Telephone Encounter (Signed)
Reasonable to take Benadryl and Tylenol prophylactically prior to scheduled SQ infusion.  Also reasonable to provide with Rx for EpiPen.  Otherwise, glad that she is feeling better after her infusion and please keep [place] informed on any issues.

## 2020-05-19 NOTE — Telephone Encounter (Signed)
A prescription for Epi Pen has been sent to pharmacy. Patient notified

## 2020-05-19 NOTE — Progress Notes (Signed)
Epi pen

## 2020-05-24 ENCOUNTER — Inpatient Hospital Stay: Payer: PRIVATE HEALTH INSURANCE

## 2020-05-24 ENCOUNTER — Other Ambulatory Visit: Payer: Self-pay

## 2020-05-24 VITALS — BP 112/67 | HR 79 | Resp 16

## 2020-05-24 DIAGNOSIS — D500 Iron deficiency anemia secondary to blood loss (chronic): Secondary | ICD-10-CM | POA: Diagnosis not present

## 2020-05-24 MED ORDER — SODIUM CHLORIDE 0.9 % IV SOLN
Freq: Once | INTRAVENOUS | Status: AC
  Filled 2020-05-24: qty 250

## 2020-05-24 MED ORDER — IRON SUCROSE 20 MG/ML IV SOLN
200.0000 mg | Freq: Once | INTRAVENOUS | Status: AC
  Administered 2020-05-24: 200 mg via INTRAVENOUS
  Filled 2020-05-24: qty 200

## 2020-05-24 NOTE — Patient Instructions (Addendum)

## 2020-05-31 ENCOUNTER — Inpatient Hospital Stay: Payer: PRIVATE HEALTH INSURANCE

## 2020-06-03 ENCOUNTER — Encounter: Payer: Self-pay | Admitting: Physician Assistant

## 2020-06-03 ENCOUNTER — Other Ambulatory Visit (HOSPITAL_COMMUNITY): Payer: Self-pay | Admitting: Physician Assistant

## 2020-06-03 NOTE — Progress Notes (Unsigned)
I connected by phone with Kristina Gentry on 06/03/2020 at 1:00 PM to discuss the potential use of a new treatment for mild to moderate COVID-19 viral infection in non-hospitalized patients.  This patient is a 59 y.o. female that meets the FDA criteria for Emergency Use Authorization of COVID monoclonal antibody casirivimab/imdevimab, bamlanivimab/etesevimab, or sotrovimab.  Has a (+) direct SARS-CoV-2 viral test result  Has mild or moderate COVID-19   Is NOT hospitalized due to COVID-19  Is within 10 days of symptom onset  Has at least one of the high risk factor(s) for progression to severe COVID-19 and/or hospitalization as defined in EUA.  Specific high risk criteria : BMI > 25, Immunosuppressive Disease or Treatment and Other high risk medical condition per CDC:  unvaccinated   I have spoken and communicated the following to the patient or parent/caregiver regarding COVID monoclonal antibody treatment:  1. FDA has authorized the emergency use for the treatment of mild to moderate COVID-19 in adults and pediatric patients with positive results of direct SARS-CoV-2 viral testing who are 67 years of age and older weighing at least 40 kg, and who are at high risk for progressing to severe COVID-19 and/or hospitalization.  2. The significant known and potential risks and benefits of COVID monoclonal antibody, and the extent to which such potential risks and benefits are unknown.  3. Information on available alternative treatments and the risks and benefits of those alternatives, including clinical trials.  4. Patients treated with COVID monoclonal antibody should continue to self-isolate and use infection control measures (e.g., wear mask, isolate, social distance, avoid sharing personal items, clean and disinfect "high touch" surfaces, and frequent handwashing) according to CDC guidelines.   5. The patient or parent/caregiver has the option to accept or refuse COVID monoclonal antibody  treatment.  After reviewing this information with the patient, the patient has agreed to receive one of the available covid 19 monoclonal antibodies and will be provided an appropriate fact sheet prior to infusion. Kristina Felix, PA-C 06/03/2020 1:00 PM

## 2020-06-04 ENCOUNTER — Other Ambulatory Visit (HOSPITAL_COMMUNITY): Payer: Self-pay | Admitting: Pulmonary Disease

## 2020-06-04 ENCOUNTER — Ambulatory Visit (HOSPITAL_COMMUNITY)
Admission: RE | Admit: 2020-06-04 | Discharge: 2020-06-04 | Disposition: A | Discharge status: Home / Self Care | Payer: No Typology Code available for payment source | Source: Ambulatory Visit | Attending: Pulmonary Disease | Admitting: Pulmonary Disease

## 2020-06-04 DIAGNOSIS — U071 COVID-19: Secondary | ICD-10-CM | POA: Insufficient documentation

## 2020-06-04 MED ORDER — SODIUM CHLORIDE 0.9 % IV SOLN: INTRAVENOUS | Status: DC | PRN

## 2020-06-04 MED ORDER — EPINEPHRINE 0.3 MG/0.3ML IJ SOAJ: 0.3000 mg | Freq: Once | INTRAMUSCULAR | Status: DC | PRN

## 2020-06-04 MED ORDER — FAMOTIDINE IN NACL 20-0.9 MG/50ML-% IV SOLN: 20.0000 mg | Freq: Once | INTRAVENOUS | Status: DC | PRN

## 2020-06-04 MED ORDER — DIPHENHYDRAMINE HCL 50 MG/ML IJ SOLN: 50.0000 mg | Freq: Once | INTRAMUSCULAR | Status: DC | PRN

## 2020-06-04 MED ORDER — ALBUTEROL SULFATE HFA 108 (90 BASE) MCG/ACT IN AERS: 2.0000 | INHALATION_SPRAY | Freq: Once | RESPIRATORY_TRACT | Status: DC | PRN

## 2020-06-04 MED ORDER — METHYLPREDNISOLONE SODIUM SUCC 125 MG IJ SOLR: 125.0000 mg | Freq: Once | INTRAMUSCULAR | Status: DC | PRN

## 2020-06-04 MED ORDER — SODIUM CHLORIDE 0.9 % IV SOLN: Freq: Once | INTRAVENOUS | Status: AC

## 2020-06-04 NOTE — Progress Notes (Signed)
Note: BP elevated mildly.  Pt did not take BP med this morning. Denies dizziness, chest discomort or shortness of breath; admits to mild ongoing headache. Pt agrees to measure BP at home today and take BP med, if BP elevated.  Advised to check BP twice daily; if over 952'W systolic over next week, call PCP; pt indicates understanding.   Diagnosis: COVID-19  Physician:  Dr. Asencion Noble  Procedure: [facility] Med: casirivimab\imdevimab infusion - Provided patient with casirivimab\imdevimab fact sheet for patients, parents and caregivers prior to infusion.  Complications: No immediate complications noted.  Discharge: Discharged home   Monna Fam 06/04/2020

## 2020-06-04 NOTE — Discharge Instructions (Signed)
10 Things You Can Do to Manage Your COVID-19 Symptoms at Home If you have possible or confirmed COVID-19: 1. Stay home from work and school. And stay away from other public places. If you must go out, avoid using any kind of public transportation, ridesharing, or taxis. 2. Monitor your symptoms carefully. If your symptoms get worse, call your healthcare provider immediately. 3. Get rest and stay hydrated. 4. If you have a medical appointment, call the healthcare provider ahead of time and tell them that you have or may have COVID-19. 5. For medical emergencies, call 911 and notify the dispatch personnel that you have or may have COVID-19. 6. Cover your cough and sneezes with a tissue or use the inside of your elbow. 7. Wash your hands often with soap and water for at least 20 seconds or clean your hands with an alcohol-based hand sanitizer that contains at least 60% alcohol. 8. As much as possible, stay in a specific room and away from other people in your home. Also, you should use a separate bathroom, if available. If you need to be around other people in or outside of the home, wear a mask. 9. Avoid sharing personal items with other people in your household, like dishes, towels, and bedding. 10. Clean all surfaces that are touched often, like counters, tabletops, and doorknobs. Use household cleaning sprays or wipes according to the label instructions. [email or web address] 12/04/2018 This information is not intended to replace advice given to you by your health care provider. Make sure you discuss any questions you have with your health care provider. Document Revised: 05/08/2019 Document Reviewed: 05/08/2019 Elsevier Patient Education  [place].  What types of side effects do monoclonal antibody drugs cause?  Common side effects  In general, the more common side effects caused by monoclonal antibody drugs include: . Allergic reactions, such as hives or itching . Flu-like signs and  symptoms, including chills, fatigue, fever, and muscle aches and pains . Nausea, vomiting . Diarrhea . Skin rashes . Low blood pressure   The CDC is recommending patients who receive monoclonal antibody treatments wait at least 90 days before being vaccinated.  Currently, there are no data on the safety and efficacy of mRNA COVID-19 vaccines in persons who received monoclonal antibodies or convalescent plasma as part of COVID-19 treatment. Based on the estimated half-life of such therapies as well as evidence suggesting that reinfection is uncommon in the 90 days after initial infection, vaccination should be deferred for at least 90 days, as a precautionary measure until additional information becomes available, to avoid interference of the antibody treatment with vaccine-induced immune responses.   Patient reviewed Fact Sheet for Patients, Parents, and Caregivers for Emergency Use Authorization (EUA) of Sotrovimab for the Treatment of Coronavirus.  Patient also reviewed and is agreeable to the estimated cost of treatment.  Patient is agreeable to proceed.     If you have any questions or concerns after the infusion please call the Advanced Practice Provider on call at [phone]. This number is ONLY intended for your use regarding questions or concerns about the infusion post-treatment side-effects.  Please do not provide this number to others for use. For return to work notes please contact your primary care provider.   If someone you know is interested in receiving treatment please have them call the [place] hotline at [phone].

## 2020-06-04 NOTE — Progress Notes (Signed)
Patient reviewed Fact Sheet for Patients, Parents, and Caregivers for Emergency Use Authorization (EUA) of Regen-Cov for the Treatment of Coronavirus.  Patient also reviewed and is agreeable to the estimated cost of treatment.  Patient is agreeable to proceed.

## 2020-06-08 ENCOUNTER — Telehealth: Payer: Self-pay | Admitting: Family

## 2020-06-08 NOTE — Telephone Encounter (Signed)
Called and LMVM for patient regarding appointment date/time due to weather on 1/3.  Per 1/3 sch msg

## 2020-06-09 ENCOUNTER — Ambulatory Visit: Payer: No Typology Code available for payment source

## 2020-06-14 ENCOUNTER — Other Ambulatory Visit: Payer: Self-pay | Admitting: Gastroenterology

## 2020-06-17 ENCOUNTER — Inpatient Hospital Stay: Payer: No Typology Code available for payment source

## 2020-06-17 ENCOUNTER — Inpatient Hospital Stay: Payer: No Typology Code available for payment source | Admitting: Family

## 2020-06-22 ENCOUNTER — Inpatient Hospital Stay: Payer: No Typology Code available for payment source

## 2020-06-22 ENCOUNTER — Inpatient Hospital Stay: Payer: No Typology Code available for payment source | Admitting: Family

## 2020-06-25 ENCOUNTER — Inpatient Hospital Stay: Payer: No Typology Code available for payment source

## 2020-06-25 ENCOUNTER — Inpatient Hospital Stay: Payer: No Typology Code available for payment source | Admitting: Family

## 2020-06-30 ENCOUNTER — Inpatient Hospital Stay: Payer: No Typology Code available for payment source

## 2020-06-30 ENCOUNTER — Telehealth: Payer: Self-pay

## 2020-06-30 ENCOUNTER — Inpatient Hospital Stay: Payer: No Typology Code available for payment source | Admitting: Family

## 2020-06-30 NOTE — Telephone Encounter (Signed)
Pt called in stating that she had a fever and body aches and is questioning if she has the flu as she has already had covid, appts were r/s and her call was transferred to the desk nurse     [name]

## 2020-07-09 ENCOUNTER — Other Ambulatory Visit: Payer: Self-pay | Admitting: Gastroenterology

## 2020-07-09 ENCOUNTER — Telehealth: Payer: Self-pay | Admitting: Gastroenterology

## 2020-07-09 DIAGNOSIS — D509 Iron deficiency anemia, unspecified: Secondary | ICD-10-CM

## 2020-07-09 DIAGNOSIS — K51919 Ulcerative colitis, unspecified with unspecified complications: Secondary | ICD-10-CM

## 2020-07-09 MED ORDER — PREDNISONE 20 MG PO TABS: 20.0000 mg | ORAL_TABLET | Freq: Every day | ORAL | 0 refills | Status: DC

## 2020-07-09 NOTE — Telephone Encounter (Signed)
Patient tested positive for Covid 12/29 and received monoclonal Ab infusion 12/31. Possible that the change in stools are related to the recent Covid infection, whether directly from the virus or indirectly from change in microbiome. Plan for the following:  - Check GI PCR panel, C diff, ESR, CRP, fecal calprotectin, BMP - Ok to order Prednisone 20 mg/day, but will need to modify if above infectious w/u positive - Schedule f/u appt with me next week - Continue fluids PO

## 2020-07-09 NOTE — Telephone Encounter (Signed)
Spoke to patient to inform her of Dr Lavon Paganini recommendations. She is scheduled for an appointment next week 07/15/20 240 pm. Patient will have labs done next week. Prednisone sent to her pharmacy to take as ordered.All questionsa answered. Patient voiced understanding.

## 2020-07-09 NOTE — Telephone Encounter (Signed)
Spoke to patient who had her first Stelara infusion on 05/18/20. She was taking prednisone 10 mg at that time and has noticed over the past week her stools becoming much more loose. Her Stelara injection is due around 07/13/20. Patient is requesting a new prescription for prednisone 15 mg taper until her body gets use to the injections.Also, she had monoclonal antibodies for the flu on 06/04/21 and was wondering if that would interfere with her up coming stelara injection. Please advise

## 2020-07-13 ENCOUNTER — Telehealth: Payer: Self-pay | Admitting: *Deleted

## 2020-07-13 ENCOUNTER — Inpatient Hospital Stay: Payer: PRIVATE HEALTH INSURANCE

## 2020-07-13 ENCOUNTER — Other Ambulatory Visit: Payer: Self-pay

## 2020-07-13 ENCOUNTER — Inpatient Hospital Stay: Payer: PRIVATE HEALTH INSURANCE | Admitting: Family

## 2020-07-13 ENCOUNTER — Inpatient Hospital Stay: Payer: PRIVATE HEALTH INSURANCE | Attending: Family

## 2020-07-13 ENCOUNTER — Encounter: Payer: Self-pay | Admitting: Family

## 2020-07-13 VITALS — BP 137/82 | HR 86 | Temp 99.0°F | Resp 18 | Ht 67.0 in | Wt 189.0 lb

## 2020-07-13 DIAGNOSIS — K922 Gastrointestinal hemorrhage, unspecified: Secondary | ICD-10-CM | POA: Diagnosis not present

## 2020-07-13 DIAGNOSIS — K909 Intestinal malabsorption, unspecified: Secondary | ICD-10-CM | POA: Insufficient documentation

## 2020-07-13 DIAGNOSIS — I10 Essential (primary) hypertension: Secondary | ICD-10-CM | POA: Insufficient documentation

## 2020-07-13 DIAGNOSIS — Z79899 Other long term (current) drug therapy: Secondary | ICD-10-CM | POA: Insufficient documentation

## 2020-07-13 DIAGNOSIS — K5190 Ulcerative colitis, unspecified, without complications: Secondary | ICD-10-CM | POA: Insufficient documentation

## 2020-07-13 DIAGNOSIS — D500 Iron deficiency anemia secondary to blood loss (chronic): Secondary | ICD-10-CM | POA: Diagnosis not present

## 2020-07-13 LAB — RETICULOCYTES
Immature Retic Fract: 12.6 % (ref 2.3–15.9)
RBC.: 4.07 MIL/uL (ref 3.87–5.11)
Retic Count, Absolute: 58.6 10*3/uL (ref 19.0–186.0)
Retic Ct Pct: 1.4 % (ref 0.4–3.1)

## 2020-07-13 LAB — CBC WITH DIFFERENTIAL (CANCER CENTER ONLY)
Abs Immature Granulocytes: 0.07 10*3/uL (ref 0.00–0.07)
Basophils Absolute: 0.1 10*3/uL (ref 0.0–0.1)
Basophils Relative: 1 %
Eosinophils Absolute: 0.1 10*3/uL (ref 0.0–0.5)
Eosinophils Relative: 1 %
HCT: 38.3 % (ref 36.0–46.0)
Hemoglobin: 11.8 g/dL — ABNORMAL LOW (ref 12.0–15.0)
Immature Granulocytes: 1 %
Lymphocytes Relative: 13 %
Lymphs Abs: 0.9 10*3/uL (ref 0.7–4.0)
MCH: 28.6 pg (ref 26.0–34.0)
MCHC: 30.8 g/dL (ref 30.0–36.0)
MCV: 92.7 fL (ref 80.0–100.0)
Monocytes Absolute: 0.4 10*3/uL (ref 0.1–1.0)
Monocytes Relative: 6 %
Neutro Abs: 5.4 10*3/uL (ref 1.7–7.7)
Neutrophils Relative %: 78 %
Platelet Count: 312 10*3/uL (ref 150–400)
RBC: 4.13 MIL/uL (ref 3.87–5.11)
RDW: 15.7 % — ABNORMAL HIGH (ref 11.5–15.5)
WBC Count: 6.9 10*3/uL (ref 4.0–10.5)
nRBC: 0 % (ref 0.0–0.2)

## 2020-07-13 NOTE — Progress Notes (Signed)
Hematology and Oncology Follow Up Visit  Kristina Gentry [id number] 1961-01-05 60 y.o. 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 She states that she is doing so much better and that her symptoms have resolved.  Hgb is now 11.8! She got her first Stelara injection recently and had some hypertension and chest tightness right after. She has been prescribed an epi pen if needed. She has noted since starting the Stelara she had not had any noticable rectal blood loss.  No bruising or petechiae.  No fever, chills, n/v, cough, rash, dizziness, SOB, chest pain, palpitations, abdominal pain or changes in bowel or bladder habits.  No swelling, tenderness, numbness or tingling in her extremities. No falls or syncope.  She has maintained a good appetite and is staying well hydrated. Her weight is stable at 189 lbs.   ECOG Performance Status: 1 - Symptomatic but completely ambulatory  Medications:  Allergies as of 07/13/2020   No Known Allergies     Medication List       Accurate as of July 13, 2020  2:18 PM. If you have any questions, ask your nurse or doctor.        ALPRAZolam 0.5 MG tablet Commonly known as: XANAX Take 0.5 mg by mouth 2 (two) times daily as needed for sleep.   buPROPion 300 MG 24 hr tablet Commonly known as: WELLBUTRIN XL Take 300 mg by mouth daily.   C-500 500 MG Chew Generic drug: Ascorbic Acid Chew 500 mg by mouth daily.   CALCIUM + D PO Take 1 tablet by mouth 2 (two) times daily.   CO Q10 PO Take 1 tablet by mouth daily.   dicyclomine 10 MG capsule Commonly known as: Bentyl Take 2 capsules (20 mg total) by mouth every 6 (six) hours as needed for spasms.   ELDERBERRY PO Take by mouth.   EPINEPHrine 0.3 mg/0.3 mL Soaj injection Commonly known  as: EPI-PEN Inject 0.3 mg into the muscle as needed for anaphylaxis.   ferrous sulfate 325 (65 FE) MG tablet Take 325 mg by mouth daily with breakfast.   GLUCOSAMINE-CHONDROITIN PO Take 1 tablet by mouth daily.   HYDROcodone-acetaminophen 5-325 MG tablet Commonly known as: NORCO/VICODIN Take 1 tablet by mouth every 6 (six) hours as needed for moderate pain.   levothyroxine 200 MCG tablet Commonly known as: SYNTHROID Take 200 mcg by mouth daily.   levothyroxine 175 MCG tablet Commonly known as: SYNTHROID Take 200 mcg by mouth daily.   lisdexamfetamine 60 MG capsule Commonly known as: VYVANSE Take 60 mg by mouth every morning.   lisinopril 10 MG tablet Commonly known as: ZESTRIL Take 10 mg by mouth daily.   mesalamine 1000 MG suppository Commonly known as: CANASA Place 1 suppository (1,000 mg total) rectally at bedtime.   mesalamine 0.375 g 24 hr capsule Commonly known as: APRISO TAKE 4 CAPSULES BY MOUTH EVERY DAY IN THE MORNING   multivitamin tablet Take 1 tablet by mouth daily.   nitrofurantoin (macrocrystal-monohydrate) 100 MG capsule Commonly known as: MACROBID Take 100 mg by mouth 2 (two) times daily.   predniSONE 20 MG tablet Commonly known as: DELTASONE Take 1 tablet (20 mg total) by mouth daily with breakfast.   TURMERIC PO Take 1 tablet by mouth daily.  VITAMIN B COMPLEX PO Take 1 capsule by mouth daily.   Vitamin D (Ergocalciferol) 1.25 MG (50000 UNIT) Caps capsule Commonly known as: DRISDOL Take 50,000 Units by mouth once a week.       Allergies: No Known Allergies  Past Medical History, Surgical history, Social history, and Family History were reviewed and updated.  Review of Systems: All other 10 point review of systems is negative.   Physical Exam:  vitals were not taken for this visit.   Wt Readings from Last 3 Encounters:  05/06/20 181 lb (82.1 kg)  04/08/20 179 lb (81.2 kg)  03/04/20 178 lb 8 oz (81 kg)    Ocular: Sclerae  unicteric, pupils equal, round and reactive to light Ear-nose-throat: Oropharynx clear, dentition fair Lymphatic: No cervical or supraclavicular adenopathy Lungs no rales or rhonchi, good excursion bilaterally Heart regular rate and rhythm, no murmur appreciated Abd soft, nontender, positive bowel sounds MSK no focal spinal tenderness, no joint edema Neuro: non-focal, well-oriented, appropriate affect Breasts: Deferred   Lab Results  Component Value Date   WBC 6.9 07/13/2020   HGB 11.8 (L) 07/13/2020   HCT 38.3 07/13/2020   MCV 92.7 07/13/2020   PLT 312 07/13/2020   Lab Results  Component Value Date   FERRITIN 192 05/06/2020   IRON 27 (L) 05/06/2020   TIBC 239 05/06/2020   UIBC 212 05/06/2020   IRONPCTSAT 11 (L) 05/06/2020   Lab Results  Component Value Date   RETICCTPCT 1.4 07/13/2020   RBC 4.07 07/13/2020   No results found for: KPAFRELGTCHN, LAMBDASER, KAPLAMBRATIO No results found for: IGGSERUM, IGA, IGMSERUM No results found for: Odetta Pink, SPEI   Chemistry      Component Value Date/Time   NA 136 04/08/2020 0837   K 4.1 04/08/2020 0837   CL 101 04/08/2020 0837   CO2 27 04/08/2020 0837   BUN 13 04/08/2020 0837   CREATININE 0.92 04/08/2020 0837      Component Value Date/Time   CALCIUM 9.6 04/08/2020 0837   ALKPHOS 48 04/08/2020 0837   AST 9 (L) 04/08/2020 0837   ALT 12 04/08/2020 0837   BILITOT 0.2 (L) 04/08/2020 0837       Impression and Plan: Kristina Gentry is a very pleasant 60 yo caucasian female with iron deficiency anemia secondary to GI blood loss with ulcerative colitis.  She has had a nice response to IV iron and is feeling much better.  Iron studies are pending. We will replace if needed.  Follow-up in 3 months.  She can contact our office with any questions or concerns.   Laverna Peace, NP 2/8/20222:18 PM

## 2020-07-13 NOTE — Telephone Encounter (Signed)
Call patient and gave upcoming appts. - view in [place]

## 2020-07-14 ENCOUNTER — Telehealth: Payer: Self-pay

## 2020-07-14 LAB — IRON AND TIBC
Iron: 56 ug/dL (ref 41–142)
Saturation Ratios: 20 % — ABNORMAL LOW (ref 21–57)
TIBC: 275 ug/dL (ref 236–444)
UIBC: 219 ug/dL (ref 120–384)

## 2020-07-14 LAB — FERRITIN: Ferritin: 117 ng/mL (ref 11–307)

## 2020-07-14 NOTE — Telephone Encounter (Signed)
Called pt and she is aware of her iron tx appts   Kristina Gentry

## 2020-07-15 ENCOUNTER — Ambulatory Visit: Payer: No Typology Code available for payment source | Admitting: Gastroenterology

## 2020-07-22 ENCOUNTER — Ambulatory Visit: Payer: No Typology Code available for payment source | Admitting: Gastroenterology

## 2020-07-22 ENCOUNTER — Inpatient Hospital Stay: Payer: PRIVATE HEALTH INSURANCE

## 2020-07-22 ENCOUNTER — Other Ambulatory Visit: Payer: Self-pay

## 2020-07-22 VITALS — BP 126/68 | HR 78 | Temp 98.0°F | Resp 20

## 2020-07-22 DIAGNOSIS — D500 Iron deficiency anemia secondary to blood loss (chronic): Secondary | ICD-10-CM | POA: Diagnosis not present

## 2020-07-22 MED ORDER — SODIUM CHLORIDE 0.9 % IV SOLN
Freq: Once | INTRAVENOUS | Status: AC
  Filled 2020-07-22: qty 250

## 2020-07-22 MED ORDER — SODIUM CHLORIDE 0.9 % IV SOLN
200.0000 mg | Freq: Once | INTRAVENOUS | Status: AC
  Administered 2020-07-22: 200 mg via INTRAVENOUS
  Filled 2020-07-22: qty 200

## 2020-07-22 NOTE — Progress Notes (Signed)
Pt refused to stay for 30 minute post observation after receiving Venofer.  Pt without complaints at time of discharge.

## 2020-07-22 NOTE — Patient Instructions (Signed)

## 2020-07-26 ENCOUNTER — Telehealth: Payer: Self-pay | Admitting: Gastroenterology

## 2020-07-26 NOTE — Telephone Encounter (Signed)
Pt is requesting a call back from a nurse to discuss her STELARA, pt did not disclose any further info

## 2020-07-26 NOTE — Telephone Encounter (Signed)
Spoke to patient to inform her that her prescription has been sent to [facility] # [id number]. The pharmacist stated the medication Stelara 69m sq every 8 weeks has been approved. Patient will have her medication delivered to her house. Address verified

## 2020-07-28 NOTE — Telephone Encounter (Signed)
Spoke to patient who has changed her pharmacy to [facility] [phone] patient profile [phone]. Called in her Stelara 90 mg sq every 8 weeks to be delivered to her house.

## 2020-07-28 NOTE — Telephone Encounter (Signed)
Pt is requesting a call back from a nurse to discuss her Stelara script.

## 2020-07-28 NOTE — Telephone Encounter (Signed)
Pt is requesting another call from the nurse regarding her Stelara

## 2020-07-28 NOTE — Telephone Encounter (Signed)
Spoke to patient. She is scheduling a delivery for her Stelara  prescription.

## 2020-07-29 ENCOUNTER — Inpatient Hospital Stay: Payer: PRIVATE HEALTH INSURANCE

## 2020-08-02 NOTE — Telephone Encounter (Signed)
[facility] called stating that PA is needed for Stelara. They will be faxing PA form to [phone]. Key for Cover my meds is [id number]. Any questions please call pt's insurance at [phone].

## 2020-08-02 NOTE — Telephone Encounter (Signed)
Noted,waiting on PA

## 2020-08-03 ENCOUNTER — Ambulatory Visit: Payer: No Typology Code available for payment source | Admitting: Gastroenterology

## 2020-08-03 NOTE — Telephone Encounter (Signed)
Talked with Margreta Journey from [employer] and she said that everything was finalized on 2/28 and that if there is any question the insurance and or patient can call. Initial number called was [phone]

## 2020-08-05 ENCOUNTER — Inpatient Hospital Stay: Payer: PRIVATE HEALTH INSURANCE | Attending: Family

## 2020-08-10 ENCOUNTER — Other Ambulatory Visit (INDEPENDENT_AMBULATORY_CARE_PROVIDER_SITE_OTHER): Payer: No Typology Code available for payment source

## 2020-08-10 DIAGNOSIS — K51919 Ulcerative colitis, unspecified with unspecified complications: Secondary | ICD-10-CM | POA: Diagnosis not present

## 2020-08-10 DIAGNOSIS — D509 Iron deficiency anemia, unspecified: Secondary | ICD-10-CM

## 2020-08-10 LAB — BASIC METABOLIC PANEL
BUN: 17 mg/dL (ref 6–23)
CO2: 29 mEq/L (ref 19–32)
Calcium: 9.6 mg/dL (ref 8.4–10.5)
Chloride: 101 mEq/L (ref 96–112)
Creatinine, Ser: 0.88 mg/dL (ref 0.40–1.20)
GFR: 72.43 mL/min (ref >60.00)
Glucose, Bld: 102 mg/dL — ABNORMAL HIGH (ref 70–99)
Potassium: 4.1 mEq/L (ref 3.5–5.1)
Sodium: 137 mEq/L (ref 135–145)

## 2020-08-10 LAB — SEDIMENTATION RATE: Sed Rate: 29 mm/hr (ref 0–30)

## 2020-08-10 LAB — HIGH SENSITIVITY CRP: CRP, High Sensitivity: 1.09 mg/L (ref 0.000–5.000)

## 2020-08-12 ENCOUNTER — Ambulatory Visit: Payer: No Typology Code available for payment source | Admitting: Gastroenterology

## 2020-08-19 ENCOUNTER — Encounter: Payer: Self-pay | Admitting: Gastroenterology

## 2020-08-19 ENCOUNTER — Other Ambulatory Visit: Payer: Self-pay

## 2020-08-19 ENCOUNTER — Ambulatory Visit (INDEPENDENT_AMBULATORY_CARE_PROVIDER_SITE_OTHER): Payer: No Typology Code available for payment source | Admitting: Gastroenterology

## 2020-08-19 VITALS — BP 114/76 | HR 100 | Ht 67.0 in | Wt 193.5 lb

## 2020-08-19 DIAGNOSIS — D509 Iron deficiency anemia, unspecified: Secondary | ICD-10-CM

## 2020-08-19 DIAGNOSIS — K5100 Ulcerative (chronic) pancolitis without complications: Secondary | ICD-10-CM

## 2020-08-19 MED ORDER — PREDNISONE 5 MG PO TABS: 5.0000 mg | ORAL_TABLET | Freq: Every day | ORAL | 0 refills | Status: AC

## 2020-08-19 NOTE — Patient Instructions (Addendum)
If you are age 59 or older, your body mass index should be between 23-30. Your Body mass index is 30.31 kg/m. If this is out of the aforementioned range listed, please consider follow up with your Primary Care Provider.  If you are age 64 or younger, your body mass index should be between 19-25. Your Body mass index is 30.31 kg/m. If this is out of the aformentioned range listed, please consider follow up with your Primary Care Provider.      Due to recent changes in healthcare laws, you may see the results of your imaging and laboratory studies on MyChart before your provider has had a chance to review them.  We understand that in some cases there may be results that are confusing or concerning to you. Not all laboratory results come back in the same time frame and the provider may be waiting for multiple results in order to interpret others.  Please give [place] 48 hours in order for your provider to thoroughly review all the results before contacting the office for clarification of your results.   We have sent the following medications to your pharmacy for you to pick up at your convenience:  Prednisone 5 mg tabs for 14 day to taper.  Follow up in 1 year.  Thank you for choosing me and [facility].  Gerrit Heck, D.O.    Thank you for choosing me and [facility].  Vito Cirigliano, D.O.

## 2020-08-19 NOTE — Progress Notes (Signed)
P  Chief Complaint:    Ulcerative Colitis  GI History: HAJAR PENNINGER a 60 y.o.femalewith a history ofsteroid responsivePanulcerative Colitis, previously followed at [facility]. Diagnosed with pan UC approx 1993 by flex sig,treated with sulfasalazine. Was then hospitalized 1996, treated with Prednisone and changed to Asacol.Eventually transitioned to Imuran, without good clinical relief requiringanother course ofsteroids(was deemed steroid dependent). 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 Was seen by Rheumatology who increased Humira to weekly. Was in reported deep remission for years, and stoppedHumiraon her own approx 2015. Had been in remission since then untilher flare in11/2020.  Started interview 10/2019, but ongoing symptoms despite adequate trial and negative Ab (primary nonresponder).  Transition to Stelara 03/2020 with excellent clinical response along with resolution of arthropathies.  Index sxs of hematochezia, increased stool frequency, urgency, loose stools.  Evaluation to date:  - TPMT:N/A (previous Imuran failure) - TB testing:QuantiFERON goldnegative12/2020 - HBV status:HBsAb-, HBsAg-;startedHBVvaccineseries - Last colonoscopy:08/15/2019 - Small bowel imaging:None - History of EIMs:?Arthropathies (Remicade induced?)  Medications to date:Sulfasalazine, prednisone, Asacol, Imuran, Remicade, Humira, Canasa, Apriso, Entyvio Current medications:Stelara 90 mg SQ Q8 weeks, Apriso 1.5 g/day, prednisone 20 mg/day (weaning now)  Health Maintenance:  - DEXA:Normal 05/2019 (prior history of osteopenia) - Vaccinations: -recommended flu vaccine, pneumonia vaccine -Started HBV/HAV vaccine series - Micronutrient eval:  - Annual Vit D, B6, iron panel:UTD - Annual Pap (if  immunosuppressed):UTD - Surveillance colonoscopy:UTD - Surveillance labs for immunomodulators:N/A - Annual depression screening:None - Annual Dermatology/Skin exam:Referral placed today  Endoscopic Hx: -Colonoscopy (08/2019, Dr. Bryan Lemma): 4 mm cecalTA, sigmoid pseudopolyps, continuous female due to inflammation from the anus to sigmoid, with transition at 55 cm, peri-AOinflammationc/wcecal patch. Normal TI -Colonoscopy (2016, [facility]): No active disease  HPI:     Patient is a 60 y.o. female presenting to the Gastroenterology Clinic for follow-up.  Last seen by me on 03/04/2020.  Due to primary Entyvio failure, transitioned to ustekinumab.   Has had significant improvement, and now feels essentially in remission. Scheduled for infusion today, which is 2 weeks late due to scheduling conflicts. Started having bloating and some urgency over last 1-1.5 weeks, but o/w no diarrhea, abd pain, hematochezia, mucus-like stools.  Still taking Prednisone 20 mg/day along with Apriso as prescribed.  Labs earlier this month with normal ESR, CRP, BMP.  Labs last month with normal iron panel, much improved H/H, now 11.8/38.3.   Review of systems:     No chest pain, no SOB, no fevers, no urinary sx   Past Medical History:  Diagnosis Date  . Acute cystitis with hematuria 05/09/2016  . ADD (attention deficit disorder)   . Atrophic vaginitis 05/09/2016  . Degenerative joint disease   . Easy bruisability 05/09/2016  . Goiter   . H/O degenerative disc disease   . Hypertension   . Hypothyroidism   . Increased frequency of urination 05/09/2016  . Leg swelling   . Ulcerative colitis ([place])   . Varicose veins   . Women's annual routine gynecological examination 05/09/2016    Patient's surgical history, family medical history, social history, medications and allergies were all reviewed in Epic    Current Outpatient Medications  Medication Sig Dispense Refill  . ALPRAZolam (XANAX) 0.5 MG  tablet Take 0.5 mg by mouth 2 (two) times daily as needed for sleep.    . Ascorbic Acid 500 MG CHEW Chew 500 mg  by mouth daily.    . B Complex Vitamins (VITAMIN B COMPLEX PO) Take 1 capsule by mouth daily.    Marland Kitchen buPROPion (WELLBUTRIN XL) 300 MG 24 hr tablet Take 300 mg by mouth daily.     . Calcium Citrate-Vitamin D (CALCIUM + D PO) Take 1 tablet by mouth 2 (two) times daily.    . Coenzyme Q10 (CO Q10 PO) Take 1 tablet by mouth daily.    Marland Kitchen dicyclomine (BENTYL) 10 MG capsule Take 2 capsules (20 mg total) by mouth every 6 (six) hours as needed for spasms. 60 capsule 3  . ELDERBERRY PO Take by mouth.    . EPINEPHrine 0.3 mg/0.3 mL IJ SOAJ injection Inject 0.3 mg into the muscle as needed for anaphylaxis. 1 each 1  . GLUCOSAMINE-CHONDROITIN PO Take 1 tablet by mouth daily.    Marland Kitchen HYDROcodone-acetaminophen (NORCO/VICODIN) 5-325 MG tablet Take 1 tablet by mouth every 6 (six) hours as needed for moderate pain. 18 tablet 0  . levothyroxine (SYNTHROID) 200 MCG tablet Take 200 mcg by mouth daily.    Marland Kitchen lisdexamfetamine (VYVANSE) 60 MG capsule Take 60 mg by mouth every morning.    Marland Kitchen lisinopril (PRINIVIL,ZESTRIL) 10 MG tablet Take 10 mg by mouth daily.     . mesalamine (APRISO) 0.375 g 24 hr capsule TAKE 4 CAPSULES BY MOUTH EVERY DAY IN THE MORNING 120 capsule 3  . mesalamine (CANASA) 1000 MG suppository Place 1 suppository (1,000 mg total) rectally at bedtime. 30 suppository 3  . Multiple Vitamin (MULTIVITAMIN) tablet Take 1 tablet by mouth daily.    . predniSONE (DELTASONE) 20 MG tablet Take 1 tablet (20 mg total) by mouth daily with breakfast. 60 tablet 0  . TURMERIC PO Take 1 tablet by mouth daily.    . Vitamin D, Ergocalciferol, (DRISDOL) 1.25 MG (50000 UNIT) CAPS capsule Take 50,000 Units by mouth once a week.     No current facility-administered medications for this visit.    Physical Exam:     BP 114/76   Pulse 100   Ht 5' 7"  (1.702 m)   Wt 193 lb 8 oz (87.8 kg)   BMI 30.31 kg/m   GENERAL:   Pleasant female in NAD PSYCH: : Cooperative, normal affect EENT:  conjunctiva pink, mucous membranes moist, neck supple without masses CARDIAC:  RRR, no murmur heard, no peripheral edema PULM: Normal respiratory effort, lungs CTA bilaterally, no wheezing ABDOMEN:  Nondistended, soft, nontender. No obvious masses, no hepatomegaly,  normal bowel sounds SKIN:  turgor, no lesions seen Musculoskeletal:  Normal muscle tone, normal strength NEURO: Alert and oriented x 3, no focal neurologic deficits   IMPRESSION and PLAN:    1) Ulcerative Pancolitis -Robust clinical response to Stelara and appears to be otherwise in clinical remission, save for some mild breakthrough symptoms in the last week and a half as she is late for her Stelara SQ treatment -Start prednisone wean.  Provided with 5 mg tablets today in order to start weaning by 5 mg/week -Continue Apriso -Complete viral hepatitis vaccine series -Placed referral to Dermatology for annual skin check -RTC in 6-12 months or sooner as needed -Plan for colonoscopy in 12 months to establish deep remission  2) Iron deficiency anemia -Secondary to underlying Ulcerative Colitis, now s/p IV iron.  H/H much improved and no further bleeding  I spent 35 minutes of time, including in depth chart review, independent review of results as outlined above, communicating results with the patient directly, face-to-face time with the patient,  coordinating care, and ordering studies and medications as appropriate, and documentation.           Lavena Bullion ,DO, FACG 08/19/2020, 11:47 AM

## 2020-08-29 ENCOUNTER — Other Ambulatory Visit: Payer: Self-pay | Admitting: Gastroenterology

## 2020-10-03 ENCOUNTER — Other Ambulatory Visit: Payer: Self-pay | Admitting: Gastroenterology

## 2020-10-11 ENCOUNTER — Inpatient Hospital Stay: Payer: PRIVATE HEALTH INSURANCE | Admitting: Family

## 2020-10-11 ENCOUNTER — Telehealth: Payer: Self-pay

## 2020-10-11 ENCOUNTER — Inpatient Hospital Stay: Payer: PRIVATE HEALTH INSURANCE | Attending: Family

## 2020-10-11 NOTE — Telephone Encounter (Signed)
Called and left a vm to r/s her no show appts today,Kristina Gentry

## 2020-11-09 ENCOUNTER — Encounter: Payer: Self-pay | Admitting: Gastroenterology

## 2020-11-09 ENCOUNTER — Encounter: Payer: Self-pay | Admitting: Family

## 2020-11-09 ENCOUNTER — Ambulatory Visit (INDEPENDENT_AMBULATORY_CARE_PROVIDER_SITE_OTHER): Payer: PRIVATE HEALTH INSURANCE | Admitting: Gastroenterology

## 2020-11-09 ENCOUNTER — Other Ambulatory Visit: Payer: Self-pay

## 2020-11-09 VITALS — BP 118/80 | HR 88 | Ht 67.0 in | Wt 197.1 lb

## 2020-11-09 DIAGNOSIS — D500 Iron deficiency anemia secondary to blood loss (chronic): Secondary | ICD-10-CM

## 2020-11-09 DIAGNOSIS — R195 Other fecal abnormalities: Secondary | ICD-10-CM | POA: Diagnosis not present

## 2020-11-09 DIAGNOSIS — K51919 Ulcerative colitis, unspecified with unspecified complications: Secondary | ICD-10-CM | POA: Diagnosis not present

## 2020-11-09 NOTE — Progress Notes (Signed)
P  Chief Complaint:    Ulcerative Colitis  GI History: Kristina Berthelot Jessupis a 60y.o.femalewith a history ofsteroid responsivePan-Ulcerative Colitis, previously followed at [place]. Diagnosed with pan UC approx 1993 by flex sig,treated with sulfasalazine. Was then hospitalized 1996, treated with Prednisone and changed to Asacol.Eventually transitioned to Imuran, without good clinical relief requiringanother course ofsteroids(was deemed steroid dependent). 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 Was seen by Rheumatology who increased Humira to weekly. Was in reported deep remission for years, and stoppedHumiraon her own approx 2015. Had been in remission since then untilher flare in11/2020.  Started Entyvio 10/2019, but ongoing symptoms despite adequate trial and negative Ab (primary nonresponder).  Transition to Stelara 03/2020 with excellent clinical response along with resolution of arthropathies.  Index sxs of hematochezia, increased stool frequency, urgency, loose stools.  Evaluation to date:  - TPMT:N/A (previous Imuran failure) - TB testing:QuantiFERON goldnegative12/2020 - HBV status:HBsAb-, HBsAg-;startedHBVvaccineseries - Last colonoscopy:08/15/2019 - Small bowel imaging:None - History of EIMs:?Arthropathies (Remicade induced?)  Medications to date:Sulfasalazine, prednisone, Asacol, Imuran, Remicade, Humira, Canasa, Apriso, Entyvio Current medications:Stelara 90 mg SQ Q8 weeks, Apriso 1.5 g/day  Health Maintenance:  - DEXA:Normal 05/2019 (prior history of osteopenia) - Vaccinations: -recommended flu vaccine, pneumonia vaccine -Started HBV/HAV vaccine series - Micronutrient eval:  - Annual Vit D, B6, iron panel:UTD - Annual Pap (if immunosuppressed):UTD - Surveillance  colonoscopy:UTD - Surveillance labs for immunomodulators:N/A - Annual depression screening:None - Annual Dermatology/Skin exam:UTD 2022  Endoscopic Hx: -Colonoscopy (08/2019, Dr. Bryan Lemma): 4 mm cecalTA, sigmoid pseudopolyps, continuous female due to inflammation from the anus to sigmoid, with transition at 55 cm, peri-AOinflammationc/wcecal patch. Normal TI -Colonoscopy (2016, [facility]): No active disease  HPI:     Patient is a 60 y.o. female presenting to the Gastroenterology Clinic for follow-up.  She was last seen by me in 08/19/2020.  At that time, was doing well on ustekinumab.  Did report having some abdominal bloating and urgency at that time, but was 2 weeks late for ustekinumab infusion.  Has since weaned off prednisone (was at 20 mg/day at that time) and continues with Apriso.  Labs at that time with normal ESR, CRP, and improved H/H and normal iron indices.  She reports increased gas, blood in stool, lower abdominal cramping and low back pain over the last 2 weeks or so. Stools are soft, but not diarrhea and no mucus. Next Stelara injection in about 2.5 weeks.  No other arthralgias.  No rash, ophthalmologic symptoms.  Does report intermittent light-colored stools that can sometimes float.  Symptoms seemingly occur at random then revert to normal formed stool.   No new labs or abdominal imaging for review.    Review of systems:     No chest pain, no SOB, no fevers, no urinary sx   Past Medical History:  Diagnosis Date  . Acute cystitis with hematuria 05/09/2016  . ADD (attention deficit disorder)   . Atrophic vaginitis 05/09/2016  . Degenerative joint disease   . Easy bruisability 05/09/2016  . Goiter   . H/O degenerative disc disease   . Hypertension   . Hypothyroidism   . Increased frequency of urination 05/09/2016  . Leg swelling   . Ulcerative colitis ([place])   . Varicose veins   . Women's annual routine gynecological examination 05/09/2016    Patient's  surgical history, family medical history, social history, medications  and allergies were all reviewed in Epic    Current Outpatient Medications  Medication Sig Dispense Refill  . ALPRAZolam (XANAX) 0.5 MG tablet Take 0.5 mg by mouth 2 (two) times daily as needed for sleep.    . Ascorbic Acid 500 MG CHEW Chew 500 mg by mouth daily.    . B Complex Vitamins (VITAMIN B COMPLEX PO) Take 1 capsule by mouth daily.    Marland Kitchen buPROPion (WELLBUTRIN XL) 300 MG 24 hr tablet Take 300 mg by mouth daily.     . Calcium Citrate-Vitamin D (CALCIUM + D PO) Take 1 tablet by mouth 2 (two) times daily.    . Coenzyme Q10 (CO Q10 PO) Take 1 tablet by mouth daily.    Marland Kitchen dicyclomine (BENTYL) 10 MG capsule Take 2 capsules (20 mg total) by mouth every 6 (six) hours as needed for spasms. 60 capsule 3  . ELDERBERRY PO Take by mouth.    . EPINEPHrine 0.3 mg/0.3 mL IJ SOAJ injection Inject 0.3 mg into the muscle as needed for anaphylaxis. 1 each 1  . GLUCOSAMINE-CHONDROITIN PO Take 1 tablet by mouth daily.    Marland Kitchen HYDROcodone-acetaminophen (NORCO/VICODIN) 5-325 MG tablet Take 1 tablet by mouth every 6 (six) hours as needed for moderate pain. 18 tablet 0  . levothyroxine (SYNTHROID) 200 MCG tablet Take 200 mcg by mouth daily.    Marland Kitchen lisdexamfetamine (VYVANSE) 60 MG capsule Take 60 mg by mouth every morning.    Marland Kitchen lisinopril (PRINIVIL,ZESTRIL) 10 MG tablet Take 10 mg by mouth daily.     . mesalamine (APRISO) 0.375 g 24 hr capsule TAKE 4 CAPSULES BY MOUTH EVERY DAY IN THE MORNING 120 capsule 3  . mesalamine (CANASA) 1000 MG suppository Place 1 suppository (1,000 mg total) rectally at bedtime. 30 suppository 3  . Multiple Vitamin (MULTIVITAMIN) tablet Take 1 tablet by mouth daily.    . TURMERIC PO Take 1 tablet by mouth daily.    . Vitamin D, Ergocalciferol, (DRISDOL) 1.25 MG (50000 UNIT) CAPS capsule Take 50,000 Units by mouth once a week.     No current facility-administered medications for this visit.    Physical Exam:     BP  118/80   Pulse 88   Ht 5' 7"  (1.702 m)   Wt 197 lb 2 oz (89.4 kg)   SpO2 98%   BMI 30.87 kg/m   GENERAL:  Pleasant female in NAD PSYCH: : Cooperative, normal affect Musculoskeletal:  Normal muscle tone, normal strength NEURO: Alert and oriented x 3, no focal neurologic deficits   IMPRESSION and PLAN:    1) Ulcerative Colitis 2) Change in bowel habits Had been doing well since starting Stelara, with recent symptoms over the last 2 weeks.  Unclear if these are breakthrough/flare or unrelated.  Discussed full DDx today with plan as follows:  - Check ESR, CRP, fecal calprotectin - Check fecal fat and fecal elastase - If ESR/CRP/calprotectin elevated, can consider decreasing Stelara interval to every 6 weeks - If work-up normal/unrevealing, plan for Bentyl prn - Resume Apriso for now - Of note, she was previously billed for Humira trough level drawn in 02/2020.  We had intended to order as Humira antibody level (was not on Humira at that time and we were checking to evaluate for safety of going back to that medication).  I will draft a letter on the patient's behalf today to see if something can be done about that bill from the laboratory  3) Iron deficiency anemia - Secondary to  underlying ulcerative colitis with good response to IV iron - Recheck CBC and iron panel now for sustained response  I spent 35 minutes of time, including in depth chart review, independent review of results as outlined above, communicating results with the patient directly, face-to-face time with the patient, coordinating care, and ordering studies and medications as appropriate, and documentation.          Lavena Bullion ,DO, FACG 11/09/2020, 2:43 PM

## 2020-11-09 NOTE — Patient Instructions (Signed)
If you are age 59 or older, your body mass index should be between 23-30. Your Body mass index is 30.87 kg/m. If this is out of the aforementioned range listed, please consider follow up with your Primary Care Provider.  If you are age 80 or younger, your body mass index should be between 19-25. Your Body mass index is 30.87 kg/m. If this is out of the aformentioned range listed, please consider follow up with your Primary Care Provider.   __________________________________________________________  The [facility] providers would like to encourage you to use [facility] to communicate with providers for non-urgent requests or questions.  Due to long hold times on the telephone, sending your provider a message by [facility] may be a faster and more efficient way to get a response.  Please allow 48 business hours for a response.  Please remember that this is for non-urgent requests.  __________________________________________________________  Please go to the 2nd floor of this building today and schedule your labwork, [place], [address].  Due to recent changes in healthcare laws, you may see the results of your imaging and laboratory studies on MyChart before your provider has had a chance to review them.  We understand that in some cases there may be results that are confusing or concerning to you. Not all laboratory results come back in the same time frame and the provider may be waiting for multiple results in order to interpret others.  Please give [place] 48 hours in order for your provider to thoroughly review all the results before contacting the office for clarification of your results.   Thank you for choosing me and [facility].  Vito Cirigliano, D.O.

## 2020-11-22 ENCOUNTER — Encounter: Payer: Self-pay | Admitting: Family

## 2020-11-22 ENCOUNTER — Other Ambulatory Visit (INDEPENDENT_AMBULATORY_CARE_PROVIDER_SITE_OTHER): Payer: PRIVATE HEALTH INSURANCE

## 2020-11-22 ENCOUNTER — Other Ambulatory Visit: Payer: Self-pay

## 2020-11-22 DIAGNOSIS — R195 Other fecal abnormalities: Secondary | ICD-10-CM

## 2020-11-22 DIAGNOSIS — D500 Iron deficiency anemia secondary to blood loss (chronic): Secondary | ICD-10-CM

## 2020-11-22 DIAGNOSIS — K51919 Ulcerative colitis, unspecified with unspecified complications: Secondary | ICD-10-CM | POA: Diagnosis not present

## 2020-11-22 NOTE — Progress Notes (Signed)
Pt requested all labs go to [facility] or [facility]. Blood tests re-ordered for [facility].

## 2020-11-23 LAB — FERRITIN: Ferritin: 142 ng/mL (ref 15–150)

## 2020-11-23 LAB — C-REACTIVE PROTEIN: CRP: 2 mg/L (ref 0–10)

## 2020-11-23 LAB — CBC
Hematocrit: 39.6 % (ref 34.0–46.6)
Hemoglobin: 12.7 g/dL (ref 11.1–15.9)
MCH: 29.7 pg (ref 26.6–33.0)
MCHC: 32.1 g/dL (ref 31.5–35.7)
MCV: 93 fL (ref 79–97)
Platelets: 314 10*3/uL (ref 150–450)
RBC: 4.28 x10E6/uL (ref 3.77–5.28)
RDW: 12.8 % (ref 11.7–15.4)
WBC: 6.8 10*3/uL (ref 3.4–10.8)

## 2020-11-23 LAB — IRON AND TIBC
Iron Saturation: 19 % (ref 15–55)
Iron: 51 ug/dL (ref 27–159)
Total Iron Binding Capacity: 268 ug/dL (ref 250–450)
UIBC: 217 ug/dL (ref 131–425)

## 2020-11-23 LAB — SEDIMENTATION RATE: Sed Rate: 28 mm/hr (ref 0–40)

## 2020-11-25 ENCOUNTER — Other Ambulatory Visit (INDEPENDENT_AMBULATORY_CARE_PROVIDER_SITE_OTHER): Payer: PRIVATE HEALTH INSURANCE

## 2020-11-25 ENCOUNTER — Other Ambulatory Visit: Payer: Self-pay

## 2020-11-25 ENCOUNTER — Telehealth: Payer: Self-pay | Admitting: Gastroenterology

## 2020-11-25 DIAGNOSIS — K51919 Ulcerative colitis, unspecified with unspecified complications: Secondary | ICD-10-CM

## 2020-11-25 DIAGNOSIS — D500 Iron deficiency anemia secondary to blood loss (chronic): Secondary | ICD-10-CM

## 2020-11-25 DIAGNOSIS — R195 Other fecal abnormalities: Secondary | ICD-10-CM

## 2020-11-25 NOTE — Telephone Encounter (Signed)
Olivia Mackie can you please see about this letter regarding the wrong lab that was ordered?  Dr Bryan Lemma can you please look at this patient's labs? Lab put the orders under the wrong doctor but patient got them done and patient says she is dropping off her stool specimen today so they may or may not be resulted

## 2020-11-25 NOTE — Telephone Encounter (Signed)
Inbound call from patient inquiring about a letter about invalid hurmira order sent to [facility] because she have not received anything and was wondering if she should. Best contact number [phone].

## 2020-11-29 LAB — CALPROTECTIN, FECAL: Calprotectin, Fecal: 113 ug/g (ref 0–120)

## 2020-11-30 NOTE — Telephone Encounter (Signed)
[employer] and spoke with [place], . Explained the test was an order entry on our part that it needed to be and adalimumab ab test. she needed to transfer me to a client service specialist to create a void for the test on 02/24/2020, no answer left a voicemail.

## 2020-11-30 NOTE — Telephone Encounter (Signed)
Contacted [facility] and spoke with Ignatius Specking -explained to her that we ordered the wrong test she gave me the number to client services to request a void.

## 2020-11-30 NOTE — Telephone Encounter (Signed)
Spoke with the patient and she Is aware that I am waiting on a call back from [facility] regarding her bill.

## 2020-12-01 LAB — PANCREATIC ELASTASE, FECAL: Pancreatic Elastase-1, Stool: 491 mcg/g

## 2020-12-02 ENCOUNTER — Telehealth: Payer: Self-pay

## 2020-12-02 NOTE — Telephone Encounter (Signed)
-----  Message from [place], DO sent at 12/02/2020  1:42 PM EDT ----- Pancreatic elastase is normal and fecal calprotectin is at upper limit of normal at 113.  The ESR/CRP were otherwise normal, so while there could be a mild amount of inflammation, does not seem like change in medical management needed at this juncture.  Please check in with the patient to see how she is feeling.

## 2020-12-02 NOTE — Telephone Encounter (Signed)
Hey Dr. Henrene Pastor you are DOD for this afternoon. I am wondering if you don't mind looking at this? Its a Dr. Vivia Ewing patient.  I spoke with patient and she said that she feels like she doing fine overall. Did have a few days of abdominal pain/cramping but she used her bentyl. She feels like from a GI standpoint she says but she wants to know if she needed to do a repeat of her fecal calprotectin since it was in the upper limit of normal?

## 2020-12-08 NOTE — Telephone Encounter (Signed)
Noted  

## 2020-12-16 DIAGNOSIS — M25531 Pain in right wrist: Secondary | ICD-10-CM | POA: Insufficient documentation

## 2021-05-19 ENCOUNTER — Ambulatory Visit: Payer: PRIVATE HEALTH INSURANCE | Admitting: Gastroenterology

## 2021-06-01 ENCOUNTER — Telehealth: Payer: Self-pay | Admitting: Gastroenterology

## 2021-06-01 DIAGNOSIS — K51919 Ulcerative colitis, unspecified with unspecified complications: Secondary | ICD-10-CM

## 2021-06-01 DIAGNOSIS — R195 Other fecal abnormalities: Secondary | ICD-10-CM

## 2021-06-01 DIAGNOSIS — K921 Melena: Secondary | ICD-10-CM

## 2021-06-01 MED ORDER — DICYCLOMINE HCL 10 MG PO CAPS
20.0000 mg | ORAL_CAPSULE | Freq: Four times a day (QID) | ORAL | 3 refills | Status: DC | PRN
Start: 2021-06-01 — End: 2023-10-19

## 2021-06-01 MED ORDER — PREDNISONE 10 MG PO TABS: ORAL_TABLET | ORAL | 0 refills | Status: AC

## 2021-06-01 NOTE — Telephone Encounter (Signed)
Kristina Gentry, pt states that she received another bill from [place] and wanted to follow up and see if you were able to speak with anyone about this. Thanks

## 2021-06-01 NOTE — Telephone Encounter (Signed)
Called and spoke with patient in regards to recommendations below. Pt states that she will come to the [place] office for lab work. Pt states that she will not be able to submit the stool study until next week. Pt is aware that we have refilled her Bentyl RX and we sent in a RX for Prednisone. Advised pt to call if she has to take the Prednisone so that we can make Dr. Bryan Lemma aware. Pt knows to take prednisone if symptoms worsen and based on lab results. Pt advised to keep her follow up appt as scheduled. Pt verbalized understanding and had no concerns at the end of the call.  Lab orders in epic. [facility].

## 2021-06-01 NOTE — Telephone Encounter (Signed)
Called and spoke with patient. She states that she has been having intermittent abdominal cramping for the last 2 weeks. Pt reports that she noticed BRB on Monday evening. Pt has noticed that she has BRB with some bowel movements and with wiping. Pt states that she usually gets these symptoms when she is about to have a UC flare. Pt denies any loose stools or diarrhea. Pt denies any SOB, fatigue, or dizziness. Pt requested a refill of Bentyl and is also requesting a short course of Prednisone to help with UC flare symptoms before they get too bad. Please advise, thanks.

## 2021-06-01 NOTE — Telephone Encounter (Signed)
Can you advise on this please?

## 2021-06-01 NOTE — Telephone Encounter (Signed)
Plan for the following: - Okay to refill Bentyl - Check CBC, ESR, CRP, fecal calprotectin - Due to upcoming long holiday weekend, will provide Rx for prednisone to take if symptoms should worsen and depending on results of the above labs.  Provide with prednisone 40 mg/day x7 days, then decrease by 10 mg/day every 7 days until complete.  Please have the patient call our office to let me know if she is starting the prednisone for my awareness.

## 2021-06-01 NOTE — Telephone Encounter (Signed)
Patient called stating she was having some breakthrough bleeding and asked of a prescription for prednisone could [facility] [place]  She is still taking her Stelara.  If there is an issue with this, please call.  Thank you.

## 2021-06-03 ENCOUNTER — Other Ambulatory Visit (INDEPENDENT_AMBULATORY_CARE_PROVIDER_SITE_OTHER): Payer: PRIVATE HEALTH INSURANCE

## 2021-06-03 DIAGNOSIS — K51919 Ulcerative colitis, unspecified with unspecified complications: Secondary | ICD-10-CM | POA: Diagnosis not present

## 2021-06-03 DIAGNOSIS — R195 Other fecal abnormalities: Secondary | ICD-10-CM

## 2021-06-03 DIAGNOSIS — K921 Melena: Secondary | ICD-10-CM | POA: Diagnosis not present

## 2021-06-03 LAB — CBC WITH DIFFERENTIAL/PLATELET
Basophils Absolute: 0.1 10*3/uL (ref 0.0–0.1)
Basophils Relative: 0.9 % (ref 0.0–3.0)
Eosinophils Absolute: 0.1 10*3/uL (ref 0.0–0.7)
Eosinophils Relative: 1.7 % (ref 0.0–5.0)
HCT: 37.4 % (ref 36.0–46.0)
Hemoglobin: 12.5 g/dL (ref 12.0–15.0)
Lymphocytes Relative: 37.2 % (ref 12.0–46.0)
Lymphs Abs: 2.4 10*3/uL (ref 0.7–4.0)
MCHC: 33.3 g/dL (ref 30.0–36.0)
MCV: 93.9 fl (ref 78.0–100.0)
Monocytes Absolute: 0.4 10*3/uL (ref 0.1–1.0)
Monocytes Relative: 5.6 % (ref 3.0–12.0)
Neutro Abs: 3.5 10*3/uL (ref 1.4–7.7)
Neutrophils Relative %: 54.6 % (ref 43.0–77.0)
Platelets: 276 10*3/uL (ref 150.0–400.0)
RBC: 3.99 Mil/uL (ref 3.87–5.11)
RDW: 14.1 % (ref 11.5–15.5)
WBC: 6.4 10*3/uL (ref 4.0–10.5)

## 2021-06-03 LAB — C-REACTIVE PROTEIN: CRP: <1 mg/dL (ref 0.5–20.0)

## 2021-06-03 LAB — SEDIMENTATION RATE: Sed Rate: 31 mm/hr — ABNORMAL HIGH (ref 0–30)

## 2021-06-07 NOTE — Telephone Encounter (Signed)
Contacted [facility] and spoke with Marge, she stated she can do nothing for me but contact our sales rep Patrick Martinique. She stated that if he needs any more information he will contact us. We ordered the wrong test and I expressed we do not want the patient to be responsible for this.

## 2021-06-17 NOTE — Telephone Encounter (Signed)
Tried to contact [facility] account client services. Spoke with Jeneen Rinks, he transferred me to billing and the voicemail stated the office was closed. Will try again.

## 2021-06-20 ENCOUNTER — Telehealth: Payer: Self-pay | Admitting: Gastroenterology

## 2021-06-20 ENCOUNTER — Ambulatory Visit: Payer: PRIVATE HEALTH INSURANCE | Admitting: Gastroenterology

## 2021-06-20 NOTE — Telephone Encounter (Signed)
Good Afternoon Dr. Bryan Lemma,  Patient called to cancel appointment today at 3:00 due to having to pick up her granddaughter from school because she tested positive for Covid.   Patient stated that she was not feeling the best either, and that she will call back at a later time to reschedule.

## 2021-08-23 ENCOUNTER — Ambulatory Visit: Payer: PRIVATE HEALTH INSURANCE | Admitting: Gastroenterology

## 2021-08-26 ENCOUNTER — Encounter: Payer: Self-pay | Admitting: Gastroenterology

## 2021-08-26 ENCOUNTER — Other Ambulatory Visit: Payer: Self-pay

## 2021-08-26 ENCOUNTER — Encounter: Payer: Self-pay | Admitting: Family

## 2021-08-26 ENCOUNTER — Telehealth: Payer: Self-pay

## 2021-08-26 ENCOUNTER — Ambulatory Visit (INDEPENDENT_AMBULATORY_CARE_PROVIDER_SITE_OTHER): Payer: PRIVATE HEALTH INSURANCE | Admitting: Gastroenterology

## 2021-08-26 VITALS — BP 140/90 | HR 91 | Ht 67.0 in | Wt 202.0 lb

## 2021-08-26 DIAGNOSIS — K51919 Ulcerative colitis, unspecified with unspecified complications: Secondary | ICD-10-CM | POA: Diagnosis not present

## 2021-08-26 DIAGNOSIS — R109 Unspecified abdominal pain: Secondary | ICD-10-CM | POA: Diagnosis not present

## 2021-08-26 DIAGNOSIS — D500 Iron deficiency anemia secondary to blood loss (chronic): Secondary | ICD-10-CM | POA: Diagnosis not present

## 2021-08-26 MED ORDER — PREDNISONE 20 MG PO TABS: 20.0000 mg | ORAL_TABLET | Freq: Every day | ORAL | 1 refills | Status: DC

## 2021-08-26 NOTE — Telephone Encounter (Signed)
Patient had concerns about how the [place] assistance program was changing and how it would affect her Stelara.  The Stelara rep gave me the information on how to sign up for the new program.  I called patient and then sent this info through mychart.  The benefits specialist will reach out next week to help and I am going to have Dr. Bryan Lemma sign a sheet so we can get a Stelara sample for patient while we get this worked out.  Sent message to Mickel Baas explaining the situation.  Patient acknowledged and understood ?

## 2021-08-26 NOTE — Patient Instructions (Addendum)
If you are age 60 or older, your body mass index should be between 23-30. Your Body mass index is 31.64 kg/m?Kristina Gentry If this is out of the aforementioned range listed, please consider follow up with your Primary Care Provider. ? ?If you are age 60 or younger, your body mass index should be between 19-25. Your Body mass index is 31.64 kg/m?Kristina Gentry If this is out of the aformentioned range listed, please consider follow up with your Primary Care Provider.  ? ?________________________________________________________ ? ?The [facility] providers would like to encourage you to use [facility] to communicate with providers for non-urgent requests or questions.  Due to long hold times on the telephone, sending your provider a message by [facility] may be a faster and more efficient way to get a response.  Please allow 48 business hours for a response.  Please remember that this is for non-urgent requests.  ?_______________________________________________________ ? ?We have sent the following medications to your pharmacy for you to pick up at your convenience: ?Prednisone  ? ?Please call in 3 weeks if you haven't heard anything regarding your Stelara. Please speak to the nurse. ? ?It was a pleasure to see you today! ? ?Gerrit Heck, D.O. ? ? ? ? ? ?We want to thank you for trusting [facility] with your care. All of our staff and providers value the relationships we have built with our patients, and it is an honor to care for you.  ? ?We are writing to let you know that [facility] will close on Oct 17, 2021, and we invite you to continue to see Dr. Carmell Austria and Gerrit Heck at the [facility] [place] office location. We are consolidating our serices at these [facility] practices to better provide care. Our office staff will work with you to ensure a seamless transition.  ? ?Gerrit Heck, DO -Dr. Bryan Lemma will be movig to [facility] at [address]  [place], [place] [address], effective Oct 17, 2021.  Contact [phone] to schedule an appointment with him.  ? ?Carmell Austria, MD- Dr. Lyndel Safe will be movig to [facility] at [address] [place],  [address], effective Oct 17, 2021.  Contact [phone] to schedule an appointment with him.  ? ?Requesting Medical Records ?If you need to request your medical records, please follow the instructions below. Your medical records are confidential, and a copy can be transferred to another provider or released to you or another person you designate only with your permission. ? ?There are several ways to request your medical records: ?Requests for medical records can be submitted through our practice.   ?You can also request your records electronically, in your MyChart account by selecting the ?Request Health Records? tab.  ?If you need additional information on how to request records, please go to [email or web address] choose Patient Information, then select Request Medical Records. ?To make an appointment or if you have any questions about your health care needs, please contact our office at [phone] and one of our staff members will be glad to assist you. ?[facility] is committed to providing exceptional care for you and our community. Thank you for allowing [place] to serve your health care needs. ?Sincerely, ? ?Windy Canny, Director [facility] ?[facility] also offers convenient virtual care options. Sore throat? Sinus problems? Cold or flu symptoms? Get care from the comfort of home with [facility] Video Visits and e-Visits. Learn more about the non-emergency conditions treated and start your virtual  visit at [email or web address] ? ?

## 2021-08-26 NOTE — Progress Notes (Signed)
? ?Chief Complaint:    Ulcerative Colitis ? ?GI History:  Kristina Gentry is a 61 y.o. female with a history of steroid responsive Pan-Ulcerative Colitis, previously followed at [place].  Diagnosed with pan UC approx 1993 by flex sig, treated with sulfasalazine. Was then hospitalized 1996, treated with Prednisone and changed to Asacol. Eventually transitioned to Imuran, without good clinical relief requiring another course of steroids (was deemed steroid dependent). 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 Was seen by Rheumatology who increased Humira to weekly. Was in reported deep remission for years, and stopped Humira on her own approx 2015. Had been in remission since then until her flare in 04/2019.  Started Entyvio 10/2019, but ongoing symptoms despite adequate trial and negative Ab (primary nonresponder).  Transition to Stelara 03/2020 with excellent clinical response along with resolution of arthropathies. ?  ?Index sxs of hematochezia, increased stool frequency, urgency, loose stools.  ?  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  ?Medications to date: Sulfasalazine, prednisone, Asacol, Imuran, Remicade, Humira, Canasa, Apriso, Entyvio ?Current medications: Stelara 90 mg SQ Q8 weeks ?  ?Health Maintenance:  ?- DEXA: Normal 05/2019 (prior history of osteopenia) ?- Vaccinations:  ?            -recommended flu vaccine, pneumonia vaccine ?            -Completed HBV/HAV vaccine series ?- Micronutrient eval:  ?     - Annual Vit D, B6, iron panel: UTD ?- Annual Pap (if immunosuppressed): UTD ?- Surveillance colonoscopy:  UTD ?- Surveillance  labs for immunomodulators: N/A ?- Annual depression screening: None ?- Annual Dermatology/Skin exam: UTD 2022 ?  ?Endoscopic Hx: ?-Colonoscopy (08/2019, Dr. Bryan Lemma): 4 mm cecal TA, sigmoid pseudopolyps, continuous female due to inflammation from the anus to sigmoid, with transition at 55 cm, peri-AO inflammation c/w cecal patch.  Normal TI ?-Colonoscopy (2016, [facility]): No active disease ? ?HPI:   ? ? ?Patient is a 61 y.o. female presenting to the Gastroenterology Clinic for follow-up.  Last seen by me on 11/09/2020.  At that time symptoms were much improved since starting Stelara.  Did have occasional increased gas, blood in stool, lower abdominal cramping. ?- Normal CBC, ESR, CRP, pancreatic elastase, ferritin, iron panel ?- Fecal calprotectin 113 (borderline) ? ?Since then, symptoms have still been relatively well controlled, with occasional "flares".  However, concern about Stelara becoming cost prohibitive and no longer will be able to receive patient assistance from [employer]. Insurance does not cover.  ?- 06/03/2021: Normal CBC.  ESR 31, normal CRP.  No medication changes indicated ? ?She had tried increasing interval of her Stelara injections out to every 10-11 weeks.  Started having breakthrough symptoms around week 9-10, which quickly improved after Stelara injection.  Described breakthrough bleeding, lower abdominal cramping.  Did have some improvement in cramping with dicyclomine prn.   ? ?Planning left shoulder surgery, and recently received steroid injections and short course of PO steroids.  ? ?Last dose 9 weeks ago. ? ?Review of systems:     No chest pain, no SOB, no fevers, no urinary sx  ? ?Past Medical History:  ?Diagnosis Date  ?  Acute cystitis with hematuria 05/09/2016  ? ADD (attention deficit disorder)   ? Atrophic vaginitis 05/09/2016  ? Degenerative joint disease   ? Easy bruisability 05/09/2016  ? Goiter   ? H/O degenerative disc disease   ? Hypertension   ? Hypothyroidism   ? Increased frequency  of urination 05/09/2016  ? Leg swelling   ? Ulcerative colitis ([place])   ? Varicose veins   ? Women's annual routine gynecological examination 05/09/2016  ? ? ?Patient's surgical history, family medical history, social history, medications and allergies were all reviewed in Epic  ? ? ?Current Outpatient Medications  ?Medication Sig Dispense Refill  ? ALPRAZolam (XANAX) 0.5 MG tablet Take 0.5 mg by mouth 2 (two) times daily as needed for sleep.    ? Ascorbic Acid 500 MG CHEW Chew 500 mg by mouth daily.    ? B Complex Vitamins (VITAMIN B COMPLEX PO) Take 1 capsule by mouth daily.    ? buPROPion (WELLBUTRIN XL) 300 MG 24 hr tablet Take 300 mg by mouth daily.     ? Calcium Citrate-Vitamin D (CALCIUM + D PO) Take 1 tablet by mouth 2 (two) times daily.    ? Coenzyme Q10 (CO Q10 PO) Take 1 tablet by mouth daily.    ? dicyclomine (BENTYL) 10 MG capsule Take 2 capsules (20 mg total) by mouth every 6 (six) hours as needed for spasms. 60 capsule 3  ? ELDERBERRY PO Take by mouth.    ? EPINEPHrine 0.3 mg/0.3 mL IJ SOAJ injection Inject 0.3 mg into the muscle as needed for anaphylaxis. 1 each 1  ? GLUCOSAMINE-CHONDROITIN PO Take 1 tablet by mouth daily.    ? HYDROcodone-acetaminophen (NORCO/VICODIN) 5-325 MG tablet Take 1 tablet by mouth every 6 (six) hours as needed for moderate pain. 18 tablet 0  ? levothyroxine (SYNTHROID) 200 MCG tablet Take 200 mcg by mouth daily.    ? lisdexamfetamine (VYVANSE) 60 MG capsule Take 60 mg by mouth every morning.    ? lisinopril (PRINIVIL,ZESTRIL) 10 MG tablet Take 10 mg by mouth daily as needed.    ? Multiple Vitamin (MULTIVITAMIN) tablet Take 1 tablet by mouth daily.    ? TURMERIC PO Take 1 tablet by mouth daily.    ? Vitamin D, Ergocalciferol, (DRISDOL) 1.25 MG (50000 UNIT) CAPS capsule Take 50,000 Units by mouth once a week.    ? ?No current facility-administered medications for this visit.  ? ? ?Physical Exam:   ? ? ?BP 140/90   Pulse 91   Ht _0  (1.702 m)   Wt 202 lb (91.6 kg)   BMI  31.64 kg/m?  ? ?GENERAL:  Pleasant female in NAD ?PSYCH: : Cooperative, normal affect ?SKIN:  turgor, no lesions seen ?Musculoskeletal:  Normal muscle tone, normal strength ?NEURO: Alert and oriented x 3, no focal neurologic deficits ? ? ?IMPRESSION and PLAN:   ? ?1) Ulcerative Colitis ?61 year old female with steroid responsive Ulcerative Colitis (pancolitis).  Currently treated with Stelara with good response.  Tried spacing out Stelara intervals on her own (as a means to try to extend her medication coverage timeline), but develops breakthrough symptoms around week 9-10, which improves with subsequent treatment.  She recently received paperwork that the Stelara would not be covered through patient assistance.  We were able to look into that in further detail this afternoon and turns out she would be changing which category of patient assistance she would be receiving, but should still be able to get assistance from [employer].  Plan for the following: ? ?- Will complete paperwork to help her keep patient assistance for continued use of Stelara ?- Will complete paperwork to potentially get her and infusion "sample" next week while paperwork for complete approval in process ?- Provided her with Rx for prednisone 20 mg scored tablets to use in case of breakthrough and if unable to get sample dose next week.  She is well versed on doing taper at 40 mg x 2 weeks and reducing by 10 mg/week ?- Check annual micronutrient panel at follow-up ?- Annual skin check with Dermatology ?-UTD on vaccines ?- Okay to use dicyclomine prn abdominal cramping ? ?2) Iron deficiency anemia ?- Resolved with IV iron and getting UC into remission ?    ?RTC in 6 months or sooner as needed ? ?I spent 35 minutes of time, including in depth chart review, independent review of results as outlined above, communicating results with the patient directly, face-to-face time with the patient, coordinating care, and ordering studies and medications as  appropriate, and documentation. ? ? ?Lavena Bullion ,DO, FACG 08/26/2021, 1:57 PM ? ?

## 2021-09-02 ENCOUNTER — Telehealth: Payer: Self-pay

## 2021-09-02 NOTE — Telephone Encounter (Signed)
-----   Message from [place] sent at 08/26/2021  2:41 PM EDT ----- ?This patient is on Stelara and needs to sign up for the new assistance program to make sure she is covered.  I called her and explained everything and told her to go to "[email or web address]" to sign up.  Then the Stelara benefits lady Barnetta Chapel will be in touch Tuesday to get this facilitated.  Also the rep is emailing me a Stelara sample form for him to sign Monday when he is scoping to get her a sample while we get this figured out. ? ?I can clarify any of this on Monday!!! ? ?

## 2021-09-02 NOTE — Telephone Encounter (Signed)
Spoke with Barnetta Chapel who stated that pt needs to fill out the pt portion of the [place] patient assistance enrollment form and we need to fill out page 6. Once the pt turns in her portion we can fax the forms all at once. We need to let Barnetta Chapel know once the forms are faxed. She can be reached at [phone]. No authorization is needed. Called pt to see when she would be able to turn in her portion and she stated she would have that done by next week.  ?

## 2021-09-08 NOTE — Telephone Encounter (Signed)
Attempted to call pt to follow up on patient assistance paperwork. Left message for pt to call back.  ?

## 2021-09-13 NOTE — Telephone Encounter (Signed)
Spoke with pt. Pt stated she preferred to show proof of income rather than complete the credit check and would have paperwork ready by next Tuesday.  ?

## 2021-09-13 NOTE — Telephone Encounter (Signed)
Kristina Gentry is helping patient to get enrolled ?

## 2021-09-21 NOTE — Telephone Encounter (Signed)
Left message for pt to call back  °

## 2021-09-21 NOTE — Telephone Encounter (Signed)
Spoke with pt and let her know we have a sample of Stelara she can pick up. Pt states she will bring in paperwork when she comes to pick up sample tomorrow 4/20.  ?

## 2021-09-22 NOTE — Telephone Encounter (Signed)
Patient called to let you know she was in a conference today and would not be able to come by and pick up the medication.  She said she could come pick it up tomorrow between 10 and 12 if that will work for you.  If there is an issue, please call her.  She said to leave a message as she would still be in the conference.  Thank you. ?

## 2021-09-23 NOTE — Telephone Encounter (Signed)
Patient picked up the Stelara sample.  ?

## 2021-09-28 NOTE — Telephone Encounter (Signed)
Attempted to call pt to see when she would be able to submit tax document for stelara pt assistance paperwork. Left message for pt to call back.  ?

## 2021-09-29 NOTE — Telephone Encounter (Signed)
Patient called this morning stating her CPA took some extended time after tax season and would be returning to the office in the morning.  She said she would pick up the information you need then and bring it by the office to you. ? ?Thank you. ?

## 2021-10-03 NOTE — Telephone Encounter (Signed)
Kristina Gentry pt assistance forms faxed to [phone] ?

## 2021-10-24 ENCOUNTER — Encounter: Payer: Self-pay | Admitting: Gastroenterology

## 2021-10-25 NOTE — Telephone Encounter (Signed)
Received call from Barnetta Chapel stating that pt was approved for assistance and should be receiving the medication today.

## 2021-11-24 DIAGNOSIS — S43432A Superior glenoid labrum lesion of left shoulder, initial encounter: Secondary | ICD-10-CM | POA: Insufficient documentation

## 2021-11-24 DIAGNOSIS — M19019 Primary osteoarthritis, unspecified shoulder: Secondary | ICD-10-CM | POA: Insufficient documentation

## 2022-02-09 ENCOUNTER — Telehealth: Payer: Self-pay | Admitting: Gastroenterology

## 2022-02-09 NOTE — Telephone Encounter (Signed)
Patient called states she has been bleeding and boating and requesting a call back. States she also need refill on Prednisone 20 mg tablet to call in at [address] [place] [address] [place]. Phone # [phone]. Please call to follow.

## 2022-02-09 NOTE — Telephone Encounter (Signed)
Spoke with pt and pt stated she has had bright red rectal bleeding on and off for the past 2 weeks. Pt reports bleeding has gotten progressively worse. Pt is also having loose stools, abdominal cramping, fullness, and nausea. Pt reports she also hasn't had an appetite.  Pt denies fever. Last stelara dose was 3 weeks ago. Pt scheduled for appointment on 02/28/22 at 2:40 pm. Pt verbalized understanding and wanted to know if she could get a refill on prednisone.

## 2022-02-09 NOTE — Telephone Encounter (Signed)
Left message for pt to call back  °

## 2022-02-09 NOTE — Telephone Encounter (Signed)
Symptoms sound suspicious for UC flare.  I understand last Stelara dose was 3 weeks ago, but had she changed the dosing interval prior to that?  Plan for the following:  - Check CBC, BMP - Check ESR, CRP, fecal calprotectin, GI PCR panel, C. difficile - Check ustekinumab trough and antibody level 1 week prior to next scheduled dose - Can give Rx for prednisone 40 mg/day x2 weeks, then reduce by 10 mg every 7 days.  Do not start prednisone until stool cultures confirm no active infection

## 2022-02-10 ENCOUNTER — Other Ambulatory Visit: Payer: Self-pay

## 2022-02-10 DIAGNOSIS — K51919 Ulcerative colitis, unspecified with unspecified complications: Secondary | ICD-10-CM

## 2022-02-10 DIAGNOSIS — K921 Melena: Secondary | ICD-10-CM

## 2022-02-10 DIAGNOSIS — R195 Other fecal abnormalities: Secondary | ICD-10-CM

## 2022-02-10 NOTE — Telephone Encounter (Signed)
Pt confirmed she has not missed a dose of stelara. Pt stated she will actually be coming home on Monday and felt like labs would not have resulted before she comes back. Unable to find a [facility] in [place], [place]. Nearest [facility] is in [place] which pt states is about an hour away from her. Also the [facility] is closed on Saturday and Sunday.  Pt is in [place] for her daughter's wedding and wanted to wait to get labs done when she gets back.

## 2022-02-10 NOTE — Telephone Encounter (Signed)
Understood. Can either try to coordinate for labs to be done there if possible or to do upon her return. Would ideally want to r/o infection as etiology for flare.

## 2022-02-15 NOTE — Telephone Encounter (Signed)
Called pt to see when she would be coming in for labs. Pt stated she would be coming in tomorrow. Pt also stated she thought her last Stelara dose was on 9/4 so ustekinumab trough and antibody lab would be due 10/23. Will place stelara lab after pt comes in for current lab work.

## 2022-02-28 ENCOUNTER — Ambulatory Visit (INDEPENDENT_AMBULATORY_CARE_PROVIDER_SITE_OTHER): Payer: PRIVATE HEALTH INSURANCE | Admitting: Gastroenterology

## 2022-02-28 ENCOUNTER — Encounter: Payer: Self-pay | Admitting: Gastroenterology

## 2022-02-28 VITALS — BP 122/86 | HR 95 | Ht 67.0 in | Wt 201.0 lb

## 2022-02-28 DIAGNOSIS — K921 Melena: Secondary | ICD-10-CM | POA: Diagnosis not present

## 2022-02-28 DIAGNOSIS — Z86010 Personal history of colonic polyps: Secondary | ICD-10-CM

## 2022-02-28 DIAGNOSIS — K51919 Ulcerative colitis, unspecified with unspecified complications: Secondary | ICD-10-CM | POA: Diagnosis not present

## 2022-02-28 DIAGNOSIS — R195 Other fecal abnormalities: Secondary | ICD-10-CM

## 2022-02-28 DIAGNOSIS — D500 Iron deficiency anemia secondary to blood loss (chronic): Secondary | ICD-10-CM | POA: Diagnosis not present

## 2022-02-28 MED ORDER — MESALAMINE 1000 MG RE SUPP: 1000.0000 mg | Freq: Every day | RECTAL | 5 refills | Status: AC

## 2022-02-28 MED ORDER — NA SULFATE-K SULFATE-MG SULF 17.5-3.13-1.6 GM/177ML PO SOLN: 1.0000 | Freq: Once | ORAL | 0 refills | Status: AC

## 2022-02-28 NOTE — Patient Instructions (Addendum)
If you are age 60 or older, your body mass index should be between 23-30. Your Body mass index is 31.48 kg/m. If this is out of the aforementioned range listed, please consider follow up with your Primary Care Provider.  If you are age 60 or younger, your body mass index should be between 19-25. Your Body mass index is 31.48 kg/m. If this is out of the aformentioned range listed, please consider follow up with your Primary Care Provider.   __________________________________________________________  The [facility] providers would like to encourage you to use [facility] to communicate with providers for non-urgent requests or questions.  Due to long hold times on the telephone, sending your provider a message by [facility] may be a faster and more efficient way to get a response.  Please allow 48 business hours for a response.  Please remember that this is for non-urgent requests.    We have sent the following medications to your pharmacy for you to pick up at your convenience: [place] have been scheduled for a colonoscopy. Please follow written instructions given to you at your visit today.  Please pick up your prep supplies at the pharmacy within the next 1-3 days. If you use inhalers (even only as needed), please bring them with you on the day of your procedure.  Thank you for choosing me and [facility].  Vito Cirigliano, D.O.

## 2022-02-28 NOTE — Progress Notes (Signed)
Chief Complaint:    Ulcerative Colitis  GI History: Kristina Gentry is a 61 y.o. female with a history of steroid responsive Pan-Ulcerative Colitis, previously followed at [place].  Diagnosed with pan UC approx 1993 by flex sig, treated with sulfasalazine. Was then hospitalized 1996, treated with Prednisone and changed to Asacol. Eventually transitioned to Imuran, without good clinical relief requiring another course of steroids (was deemed steroid dependent). 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 Was seen by Rheumatology who increased Humira to weekly. Was in reported deep remission for years, and stopped Humira on her own approx 2015. Had been in remission since then until her flare in 04/2019.  Started Entyvio 10/2019, but ongoing symptoms despite adequate trial and negative Ab (primary nonresponder).  Transition to Stelara 03/2020 with excellent clinical response along with resolution of arthropathies.   Index sxs of hematochezia, increased stool frequency, urgency, loose stools.    Evaluation to date:  - TPMT: N/A (previous Imuran failure) - TB testing: QuantiFERON gold negative 05/2019 - HBV status: HBsAb-, HBsAg-; started HBV vaccine series - Last colonoscopy: 08/15/2019 - Small bowel imaging: None - History of EIMs: ? Arthropathies (Remicade induced?)   Medications to date: Sulfasalazine, prednisone, Asacol, Imuran, Remicade, Humira, Canasa, Apriso, Entyvio Current medications: Stelara 90 mg SQ Q8 weeks Complications: IDA   Health Maintenance:  - DEXA: Normal 05/2019 (prior history of osteopenia) - Vaccinations:              -recommended flu vaccine, pneumonia vaccine             -Completed HBV/HAV vaccine series - Micronutrient eval:       - Annual Vit D, B6, iron panel: Ordered today - Annual Pap (if immunosuppressed): UTD - Surveillance  colonoscopy: Ordered today - Surveillance labs for immunomodulators: N/A - Annual depression screening: None - Annual Dermatology/Skin exam: UTD 2022   Endoscopic Hx: -Colonoscopy (08/2019, Dr. Bryan Lemma): 4 mm cecal TA, sigmoid pseudopolyps, continuous female due to inflammation from the anus to sigmoid, with transition at 55 cm, peri-AO inflammation c/w cecal patch.  Normal TI -Colonoscopy (2016, [facility]): No active disease  HPI:     Patient is a 61 y.o. female presenting to the [facility]ology [facility] for follow-up.  Last seen by me on 08/26/2021.  UC was well controlled with occasional "flares", but overall much improved on Stelara.  There was an issue about Stelara being cost prohibitive and no longer receiving patient assistance, but that was subsequently worked out.   Did have intermittent BRBPR earlier this month with abdominal cramping, fullness, nausea approximately 3 weeks after last Stelara dose. - Ordered labs (CBC, BMP, ESR, CRP, GI PCR, C. difficile, calprotectin, ustekinumab trough/antibody) but these were never completed by patient as she was traveling and now insurance issues covering labs. Being told she needs to go to [facility] or [facility] - Gave Rx for prednisone 40 mg/day x2 weeks with taper, but never picked up  Last dose of Stelara was 02/06/2022.    Today, she states she continues to have intermittent flares with increased stool frequency, with 4-5 bowel movements/day.  Stools are partially formed and can have blood mixed in stool and toilet water with essentially each BM. Lower back pain and abdominal bloating.   Being told that colonoscopy will not be covered at all either by her insurance.   Otherwise, no  new labs or abdominal imaging for review since last appointment.  Review of systems:     No chest pain, no SOB, no fevers, no urinary sx   Past Medical History:  Diagnosis Date   Acute cystitis with hematuria 05/09/2016   ADD (attention deficit  disorder)    Atrophic vaginitis 05/09/2016   Degenerative joint disease    Easy bruisability 05/09/2016   Goiter    H/O degenerative disc disease    Hypertension    Hypothyroidism    Increased frequency of urination 05/09/2016   Leg swelling    Ulcerative colitis ([place])    Varicose veins    Women's annual routine gynecological examination 05/09/2016    Patient's surgical history, family medical history, social history, medications and allergies were all reviewed in Epic    Current Outpatient Medications  Medication Sig Dispense Refill   ALPRAZolam (XANAX) 0.5 MG tablet Take 0.5 mg by mouth 2 (two) times daily as needed for sleep.     Ascorbic Acid 500 MG CHEW Chew 500 mg by mouth daily.     B Complex Vitamins (VITAMIN B COMPLEX PO) Take 1 capsule by mouth daily.     buPROPion (WELLBUTRIN XL) 300 MG 24 hr tablet Take 300 mg by mouth daily.      Calcium Citrate-Vitamin D (CALCIUM + D PO) Take 1 tablet by mouth 2 (two) times daily.     Coenzyme Q10 (CO Q10 PO) Take 1 tablet by mouth daily.     dicyclomine (BENTYL) 10 MG capsule Take 2 capsules (20 mg total) by mouth every 6 (six) hours as needed for spasms. 60 capsule 3   ELDERBERRY PO Take by mouth.     EPINEPHrine 0.3 mg/0.3 mL IJ SOAJ injection Inject 0.3 mg into the muscle as needed for anaphylaxis. 1 each 1   GLUCOSAMINE-CHONDROITIN PO Take 1 tablet by mouth daily.     HYDROcodone-acetaminophen (NORCO/VICODIN) 5-325 MG tablet Take 1 tablet by mouth every 6 (six) hours as needed for moderate pain. 18 tablet 0   levothyroxine (SYNTHROID) 200 MCG tablet Take 200 mcg by mouth daily.     lisdexamfetamine (VYVANSE) 60 MG capsule Take 60 mg by mouth every morning.     lisinopril (PRINIVIL,ZESTRIL) 10 MG tablet Take 20 mg by mouth daily as needed.     Multiple Vitamin (MULTIVITAMIN) tablet Take 1 tablet by mouth daily.     predniSONE (DELTASONE) 20 MG tablet Take 1 tablet (20 mg total) by mouth daily with breakfast. 60 tablet 1   TURMERIC PO  Take 1 tablet by mouth daily.     Vitamin D, Ergocalciferol, (DRISDOL) 1.25 MG (50000 UNIT) CAPS capsule Take 50,000 Units by mouth once a week.     No current facility-administered medications for this visit.    Physical Exam:     BP 122/86   Pulse 95   Ht 5' 7"  (1.702 m)   Wt 201 lb (91.2 kg)   BMI 31.48 kg/m   GENERAL:  Pleasant female in NAD PSYCH: : Cooperative, normal affect Musculoskeletal:  Normal muscle tone, normal strength NEURO: Alert and oriented x 3, no focal neurologic deficits   IMPRESSION and PLAN:    1) Ulcerative Colitis 2) Hematochezia 3) Change in bowel habits 4) Abdominal bloating 61 year old female with steroid responsive Ulcerative Colitis (pancolitis).  Currently treated with Stelara with good response. Plan for the following:  - Continue Stelara - Check ustekinumab trough and antibody level 1 week prior to next dose.  Will order  later after labs are back.  Depending on labs, clinical response, and ustekinumab level, discussed potentially increasing dose to 180 mcg or decreasing interval - Check ESR, CRP, fecal calprotectin now. Will submit to [facility] or [facility] per her insurance - Flu vaccine when available - Start Canasa supp 1 gm. She is able to get online at [facility] for a reasonable cost.  Sent in Rx for Canasa 1 gm PR QHS x30 days, RF 3 to [place] -Depending on labs and if no appreciable improvement with Canasa, start prednisone - Annual micronutrient panel: Vitamin D, B6, iron panel, CBC - Annual skin check with Dermatology - UTD on vaccines - Ok to use dicyclomine prn abdominal cramping - Due for colonoscopy for routine surveillance and evaluation for deep remission. Will submit prior authorization   5) Iron deficiency anemia - Resolved with IV iron and getting UC into remission - Check CBC and iron panel as above  6) History of colon polyps - Due for repeat colonoscopy for continued surveillance - Submit prior authorization as  above      The indications, risks, and benefits of colonoscopy were explained to the patient in detail. Risks include but are not limited to bleeding, perforation, adverse reaction to medications, and cardiopulmonary compromise. Sequelae include but are not limited to the possibility of surgery, hospitalization, and mortality. The patient verbalized understanding and wished to proceed. All questions answered, referred to the scheduler and bowel prep ordered. Further recommendations pending results of the exam.            [place] ,DO, FACG 02/28/2022, 3:00 PM

## 2022-03-07 ENCOUNTER — Telehealth: Payer: Self-pay | Admitting: Gastroenterology

## 2022-03-07 NOTE — Telephone Encounter (Signed)
Patient reschedule for procedure. Please send updated prep instruction.  Thank you

## 2022-03-08 ENCOUNTER — Other Ambulatory Visit: Payer: Self-pay

## 2022-03-08 DIAGNOSIS — K921 Melena: Secondary | ICD-10-CM

## 2022-03-08 DIAGNOSIS — K51919 Ulcerative colitis, unspecified with unspecified complications: Secondary | ICD-10-CM

## 2022-03-08 NOTE — Telephone Encounter (Signed)
Stelara trough and antibody lab and reminder placed.

## 2022-03-24 ENCOUNTER — Telehealth: Payer: Self-pay

## 2022-03-24 NOTE — Telephone Encounter (Signed)
Spoke with pt who stated she would need to go to a [facility] due to her insurance. Pt stated she would call back Monday or Tuesday to let us know which [facility] orders need to be faxed to.

## 2022-03-24 NOTE — Telephone Encounter (Signed)
-----   Message from Marice Potter, RN sent at 03/08/2022 10:43 AM EDT ----- Pt needs to come in for stelara trough and antibody lab on 10/23.

## 2022-03-29 ENCOUNTER — Ambulatory Visit: Payer: PRIVATE HEALTH INSURANCE | Admitting: Gastroenterology

## 2022-04-10 ENCOUNTER — Encounter: Payer: PRIVATE HEALTH INSURANCE | Admitting: Gastroenterology

## 2022-04-11 ENCOUNTER — Encounter: Payer: Self-pay | Admitting: Family

## 2022-04-18 ENCOUNTER — Telehealth: Payer: Self-pay | Admitting: Gastroenterology

## 2022-04-18 NOTE — Telephone Encounter (Signed)
Inbound call from patient cancelling procedure due to her recently finding out that insurance will not cover.

## 2022-04-20 ENCOUNTER — Encounter: Payer: PRIVATE HEALTH INSURANCE | Admitting: Gastroenterology

## 2022-10-02 ENCOUNTER — Other Ambulatory Visit: Payer: Self-pay | Admitting: Gastroenterology

## 2022-10-03 ENCOUNTER — Telehealth: Payer: Self-pay

## 2022-10-03 ENCOUNTER — Other Ambulatory Visit: Payer: Self-pay

## 2022-10-03 MED ORDER — STELARA 90 MG/ML ~~LOC~~ SOSY: 90.0000 mg | PREFILLED_SYRINGE | SUBCUTANEOUS | 11 refills | Status: DC

## 2022-10-03 NOTE — Addendum Note (Signed)
Addended by: Selinda Michaels R on: 10/03/2022 10:42 AM   Modules accepted: Orders

## 2022-10-03 NOTE — Telephone Encounter (Signed)
Patient stated she is initiating Stelera paperwork through [place] patient assistance program. Requesting if Dr. Barron Alvine would sign her paperwork. Advised patient to fill out her portion and bring provider part to our office for Dr. Barron Alvine to sign it. Patient verbalized understanding.

## 2022-10-03 NOTE — Telephone Encounter (Signed)
Stelara added to pts medications.

## 2023-02-14 DIAGNOSIS — D84821 Immunodeficiency due to drugs: Secondary | ICD-10-CM | POA: Insufficient documentation

## 2023-06-13 DIAGNOSIS — R519 Headache, unspecified: Secondary | ICD-10-CM | POA: Diagnosis not present

## 2023-06-13 DIAGNOSIS — R0602 Shortness of breath: Secondary | ICD-10-CM | POA: Diagnosis not present

## 2023-06-13 DIAGNOSIS — E890 Postprocedural hypothyroidism: Secondary | ICD-10-CM | POA: Diagnosis not present

## 2023-06-13 DIAGNOSIS — I10 Essential (primary) hypertension: Secondary | ICD-10-CM | POA: Diagnosis not present

## 2023-06-13 DIAGNOSIS — R55 Syncope and collapse: Secondary | ICD-10-CM | POA: Diagnosis not present

## 2023-06-13 DIAGNOSIS — R5383 Other fatigue: Secondary | ICD-10-CM | POA: Diagnosis not present

## 2023-06-15 DIAGNOSIS — R519 Headache, unspecified: Secondary | ICD-10-CM | POA: Diagnosis not present

## 2023-06-15 DIAGNOSIS — R0602 Shortness of breath: Secondary | ICD-10-CM | POA: Diagnosis not present

## 2023-06-15 DIAGNOSIS — R5383 Other fatigue: Secondary | ICD-10-CM | POA: Diagnosis not present

## 2023-06-15 DIAGNOSIS — E890 Postprocedural hypothyroidism: Secondary | ICD-10-CM | POA: Diagnosis not present

## 2023-06-20 DIAGNOSIS — R7989 Other specified abnormal findings of blood chemistry: Secondary | ICD-10-CM | POA: Diagnosis not present

## 2023-06-20 DIAGNOSIS — R0602 Shortness of breath: Secondary | ICD-10-CM | POA: Diagnosis not present

## 2023-06-28 DIAGNOSIS — M25572 Pain in left ankle and joints of left foot: Secondary | ICD-10-CM | POA: Diagnosis not present

## 2023-06-28 DIAGNOSIS — M2022 Hallux rigidus, left foot: Secondary | ICD-10-CM | POA: Insufficient documentation

## 2023-06-28 DIAGNOSIS — M85679 Other cyst of bone, unspecified ankle and foot: Secondary | ICD-10-CM | POA: Insufficient documentation

## 2023-06-28 DIAGNOSIS — M85672 Other cyst of bone, left ankle and foot: Secondary | ICD-10-CM | POA: Diagnosis not present

## 2023-07-11 DIAGNOSIS — J8410 Pulmonary fibrosis, unspecified: Secondary | ICD-10-CM | POA: Diagnosis not present

## 2023-07-11 DIAGNOSIS — J101 Influenza due to other identified influenza virus with other respiratory manifestations: Secondary | ICD-10-CM | POA: Diagnosis not present

## 2023-07-11 DIAGNOSIS — Z79899 Other long term (current) drug therapy: Secondary | ICD-10-CM | POA: Diagnosis not present

## 2023-07-11 DIAGNOSIS — I10 Essential (primary) hypertension: Secondary | ICD-10-CM | POA: Diagnosis not present

## 2023-08-16 ENCOUNTER — Ambulatory Visit: Payer: PRIVATE HEALTH INSURANCE | Admitting: Gastroenterology

## 2023-08-17 ENCOUNTER — Ambulatory Visit: Payer: PRIVATE HEALTH INSURANCE | Admitting: Gastroenterology

## 2023-09-05 ENCOUNTER — Encounter: Payer: Self-pay | Admitting: Physician Assistant

## 2023-10-19 ENCOUNTER — Encounter: Payer: Self-pay | Admitting: Physician Assistant

## 2023-10-19 ENCOUNTER — Ambulatory Visit: Payer: PRIVATE HEALTH INSURANCE | Admitting: Physician Assistant

## 2023-10-19 ENCOUNTER — Encounter: Payer: Self-pay | Admitting: Family

## 2023-10-19 ENCOUNTER — Other Ambulatory Visit (INDEPENDENT_AMBULATORY_CARE_PROVIDER_SITE_OTHER)

## 2023-10-19 VITALS — BP 110/76 | HR 84 | Ht 67.0 in | Wt 191.8 lb

## 2023-10-19 DIAGNOSIS — D509 Iron deficiency anemia, unspecified: Secondary | ICD-10-CM | POA: Diagnosis not present

## 2023-10-19 DIAGNOSIS — Z111 Encounter for screening for respiratory tuberculosis: Secondary | ICD-10-CM

## 2023-10-19 DIAGNOSIS — D500 Iron deficiency anemia secondary to blood loss (chronic): Secondary | ICD-10-CM

## 2023-10-19 DIAGNOSIS — E559 Vitamin D deficiency, unspecified: Secondary | ICD-10-CM

## 2023-10-19 DIAGNOSIS — Z1159 Encounter for screening for other viral diseases: Secondary | ICD-10-CM

## 2023-10-19 DIAGNOSIS — K5100 Ulcerative (chronic) pancolitis without complications: Secondary | ICD-10-CM

## 2023-10-19 DIAGNOSIS — R197 Diarrhea, unspecified: Secondary | ICD-10-CM

## 2023-10-19 DIAGNOSIS — K51919 Ulcerative colitis, unspecified with unspecified complications: Secondary | ICD-10-CM

## 2023-10-19 DIAGNOSIS — M8580 Other specified disorders of bone density and structure, unspecified site: Secondary | ICD-10-CM

## 2023-10-19 DIAGNOSIS — K921 Melena: Secondary | ICD-10-CM

## 2023-10-19 DIAGNOSIS — Z860100 Personal history of colon polyps, unspecified: Secondary | ICD-10-CM

## 2023-10-19 LAB — CBC WITH DIFFERENTIAL/PLATELET
Basophils Absolute: 0.1 10*3/uL (ref 0.0–0.1)
Basophils Relative: 1 % (ref 0.0–3.0)
Eosinophils Absolute: 0.4 10*3/uL (ref 0.0–0.7)
Eosinophils Relative: 5.9 % — ABNORMAL HIGH (ref 0.0–5.0)
HCT: 37 % (ref 36.0–46.0)
Hemoglobin: 12.2 g/dL (ref 12.0–15.0)
Lymphocytes Relative: 36.9 % (ref 12.0–46.0)
Lymphs Abs: 2.3 10*3/uL (ref 0.7–4.0)
MCHC: 32.9 g/dL (ref 30.0–36.0)
MCV: 93.2 fl (ref 78.0–100.0)
Monocytes Absolute: 0.4 10*3/uL (ref 0.1–1.0)
Monocytes Relative: 7.1 % (ref 3.0–12.0)
Neutro Abs: 3.1 10*3/uL (ref 1.4–7.7)
Neutrophils Relative %: 49.1 % (ref 43.0–77.0)
Platelets: 339 10*3/uL (ref 150.0–400.0)
RBC: 3.97 Mil/uL (ref 3.87–5.11)
RDW: 14.3 % (ref 11.5–15.5)
WBC: 6.3 10*3/uL (ref 4.0–10.5)

## 2023-10-19 LAB — HIGH SENSITIVITY CRP: CRP, High Sensitivity: 6.06 mg/L — ABNORMAL HIGH (ref 0.000–5.000)

## 2023-10-19 LAB — BASIC METABOLIC PANEL WITH GFR
BUN: 9 mg/dL (ref 6–23)
CO2: 29 meq/L (ref 19–32)
Calcium: 9.3 mg/dL (ref 8.4–10.5)
Chloride: 103 meq/L (ref 96–112)
Creatinine, Ser: 0.99 mg/dL (ref 0.40–1.20)
GFR: 61.49 mL/min (ref >60.00)
Glucose, Bld: 115 mg/dL — ABNORMAL HIGH (ref 70–99)
Potassium: 4 meq/L (ref 3.5–5.1)
Sodium: 140 meq/L (ref 135–145)

## 2023-10-19 LAB — HEPATIC FUNCTION PANEL
ALT: 14 U/L (ref 0–35)
AST: 17 U/L (ref 0–37)
Albumin: 4 g/dL (ref 3.5–5.2)
Alkaline Phosphatase: 55 U/L (ref 39–117)
Bilirubin, Direct: 0.1 mg/dL (ref 0.0–0.3)
Total Bilirubin: 0.5 mg/dL (ref 0.2–1.2)
Total Protein: 7.3 g/dL (ref 6.0–8.3)

## 2023-10-19 LAB — SEDIMENTATION RATE: Sed Rate: 15 mm/h (ref 0–30)

## 2023-10-19 MED ORDER — DICYCLOMINE HCL 10 MG PO CAPS: 20.0000 mg | ORAL_CAPSULE | Freq: Four times a day (QID) | ORAL | 3 refills | Status: AC | PRN

## 2023-10-19 MED ORDER — PREDNISONE 10 MG PO TABS: ORAL_TABLET | ORAL | 0 refills | Status: DC

## 2023-10-19 NOTE — Progress Notes (Addendum)
 10/19/2023 BIJOUX ROSSMILLER [id number] 1961/03/09  Referring provider: Olan Bering, MD Primary GI doctor: Dr. Karene Oto  ASSESSMENT AND PLAN:  UC Pancolitis UC long term, unable to tolerate imuran/antiTNF Failed Entyvio did well Stelara  Last dose of stelera a year ago or more, due to insurance Severe, symptoms returned 2 weeks ago blood in stool, 7 loose Bm's a day, some early morning symptoms, nausea, decreased appetitie, 2-3 lbs weight loss, tenesmus, lower AB pain, AB distension, no fever, chills, fatigue. No recent ABX.  - Check Diatherix stool for infection -Given prednisone  taper -X-ray two-view consider cross-sectional imaging -Check inflammatory labs, Fecal calprotectin, CBC, CMET, CRP.  -Will check HBV surface ag,  and Quantiferon Gold prior to initiation. - Uncertain best next step patient did very well on Stelara  but unsure insurance will cover it we will get antibody to evaluate may consider restarting Stelara  versus different IL 23 such as Tremfya or Skyrizi.  Will discuss with Dr. Karene Oto. - Will likely need 49-month to 1 year follow-up colonoscopy to evaluate for mucosal healing  IDA Check iron /ferritin Replace if needed  Osteopenia Check vitamin D and get DEXA   Patient Care Team: Olan Bering, MD as PCP - General (Internal Medicine)  HISTORY OF PRESENT ILLNESS: 63 y.o. female presents for evaluation of ulcerative pancolitis. Last seen in the office on 02/2022 by Dr. Karene Oto was doing well on Stelara  at that time with some increased stool and urgency started on Canasa  suppositories supposed to have vitamin D B6 iron  panel and CBC checked and scheduled for colonoscopy however this was not done.   IBD history: Diagnosed 1993 after flexible sigmoidoscopy, treated with sulfasalazine. Was then hospitalized 1996, treated with Prednisone  and changed to Asacol .  Eventually transitioned to Imuran, without good clinical relief requiring another  course of steroids (was deemed steroid dependent).  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  Was seen by Rheumatology who increased Humira to weekly. Was in reported deep remission for years, and stopped Humira on her own approx 2015.  Had been in remission since then until her flare in 04/2019.   Started Entyvio 10/2019, but ongoing symptoms despite adequate trial and negative Ab (primary nonresponder).   Transition to Stelara  03/2020 with excellent clinical response along with resolution of arthropathies.   Last colonoscopy: 08/2019, was not on medication at that time and responsive to Entyvio 10/2019 started on Stelara  03/2020 4 mm cecal TA, sigmoid pseudopolyps, continuous female due to inflammation from the anus to sigmoid, with transition at 55 cm, peri-AO inflammation c/w cecal patch.  Normal TI Last small bowel imaging: 2003 Extraintestinal manifestations: The patient has not had any extraintestinal symptoms Surgical history: no surgery.  Other significant medical history: IDA 11/22/2020 Iron  51 Ferritin 142  Osteopenia scheduled DEXA with MGM end of June  Current History Discussed the use of AI scribe software for clinical note transcription with the patient, who gave verbal consent to proceed.  History of Present Illness   ONIESHA LASKO is a 63 year old female with ulcerative colitis who presents with worsening symptoms after discontinuation of Stelara .  Her symptoms began to worsen approximately two weeks ago after discontinuation of Stelara  over a year ago due to insurance issues. She experiences an increase in bowel movements, up to seven times per day, with loose stools and blood present. Abdominal discomfort and bloating are particularly noticeable  at night. She has also noticed a weight loss of two to three pounds.  She reports fatigue, nausea, and a  decreased appetite. No fever, chills, or vomiting are present. She has a history of joint pain during flares but currently describes more generalized achiness without joint swelling or rashes.  Her past treatments include mesalamine  suppositories, which were ineffective, and prednisone , which works well for her symptoms. She has not experienced any recent infections or been on antibiotics.  Her last colonoscopy was in March 2021, and she does not recall if she was on medication at that time. She is currently on thyroid  medication following thyroid  removal and has a history of osteopenia, with a bone density test scheduled for June. She has not had any recent changes in vision or urinary symptoms.  She is not up to date on her pneumonia vaccine and has been using [facility] to manage medication costs. She takes calcium with vitamin D supplements but has not had recent vitamin D level testing.       Recent labs: 06/03/2021 CRP <1.0  06/03/2021 SED RATE 31 Fecal cal 11/25/2020 113 06/03/2021 WBC 6.4 HGB 12.5 MCV 93.9 Platelets 276.0 11/22/2020 Iron  51 Ferritin 142   TB GOLD -2020 HepBsAG negative started HPV vaccination series TPMT Activity: NA previous Imuran failure  IBD Health Care Maintenance: Reminded to get, discussed PCP  Immunization History  Administered Date(s) Administered   Hepatitis B, ADULT 10/08/2019, 11/10/2019    RELEVANT GI HISTORY, LABS, IMAGING: -Colonoscopy (08/2019, Dr. Karene Oto): 4 mm cecal TA, sigmoid pseudopolyps, continuous female due to inflammation from the anus to sigmoid, with transition at 55 cm, peri-AO inflammation c/w cecal patch.  Normal TI -Colonoscopy (2016, [facility]): No active disease  CBC    Component Value Date/Time   WBC 6.4 06/03/2021 1507   RBC 3.99 06/03/2021 1507   HGB 12.5 06/03/2021 1507   HGB 12.7 11/22/2020 1418   HCT 37.4 06/03/2021 1507   HCT 39.6 11/22/2020 1418   PLT 276.0 06/03/2021 1507   PLT 314 11/22/2020 1418   MCV 93.9  06/03/2021 1507   MCV 93 11/22/2020 1418   MCH 29.7 11/22/2020 1418   MCH 28.6 07/13/2020 1338   MCHC 33.3 06/03/2021 1507   RDW 14.1 06/03/2021 1507   RDW 12.8 11/22/2020 1418   LYMPHSABS 2.4 06/03/2021 1507   MONOABS 0.4 06/03/2021 1507   EOSABS 0.1 06/03/2021 1507   BASOSABS 0.1 06/03/2021 1507   No results for input(s): "HGB" in the last 8760 hours.  CMP     Component Value Date/Time   NA 137 08/10/2020 1526   K 4.1 08/10/2020 1526   CL 101 08/10/2020 1526   CO2 29 08/10/2020 1526   GLUCOSE 102 (H) 08/10/2020 1526   BUN 17 08/10/2020 1526   CREATININE 0.88 08/10/2020 1526   CREATININE 0.92 04/08/2020 0837   CALCIUM 9.6 08/10/2020 1526   PROT 6.8 04/08/2020 0837   ALBUMIN 3.3 (L) 04/08/2020 0837   AST 9 (L) 04/08/2020 0837   ALT 12 04/08/2020 0837   ALKPHOS 48 04/08/2020 0837   BILITOT 0.2 (L) 04/08/2020 0837   GFRNONAA >60 04/08/2020 0837      Latest Ref Rng & Units 04/08/2020    8:37 AM 03/04/2020   11:59 AM  Hepatic Function  Total Protein 6.5 - 8.1 g/dL 6.8  6.9   Albumin 3.5 - 5.0 g/dL 3.3  3.4   AST 15 - 41 U/L 9  10   ALT 0 - 44 U/L  12  11   Alk Phosphatase 38 - 126 U/L 48  50   Total Bilirubin 0.3 - 1.2 mg/dL 0.2  0.3       Current Medications:   Current Outpatient Medications (Endocrine & Metabolic):    levothyroxine (SYNTHROID) 200 MCG tablet, Take 200 mcg by mouth daily.   predniSONE  (DELTASONE ) 10 MG tablet, Take 4 tablets (40 mg total) by mouth daily with breakfast for 7 days, THEN 3 tablets (30 mg total) daily with breakfast for 7 days, THEN 2 tablets (20 mg total) daily with breakfast for 7 days, THEN 1 tablet (10 mg total) daily with breakfast for 7 days, THEN 0.5 tablets (5 mg total) daily with breakfast for 8 days.  Current Outpatient Medications (Cardiovascular):    lisinopril (PRINIVIL,ZESTRIL) 10 MG tablet, Take 20 mg by mouth daily as needed.   Current Outpatient Medications (Analgesics):    HYDROcodone -acetaminophen  (NORCO/VICODIN)  5-325 MG tablet, Take 1 tablet by mouth every 6 (six) hours as needed for moderate pain.   Current Outpatient Medications (Other):    ALPRAZolam (XANAX) 0.5 MG tablet, Take 0.5 mg by mouth 2 (two) times daily as needed for sleep.   amphetamine-dextroamphetamine (ADDERALL XR) 30 MG 24 hr capsule, Take 30 mg by mouth every morning.   Ascorbic Acid 500 MG CHEW, Chew 500 mg by mouth daily.   B Complex Vitamins (VITAMIN B COMPLEX PO), Take 1 capsule by mouth daily.   buPROPion (WELLBUTRIN XL) 300 MG 24 hr tablet, Take 300 mg by mouth daily.    Calcium Citrate-Vitamin D (CALCIUM + D PO), Take 1 tablet by mouth 2 (two) times daily.   Coenzyme Q10 (CO Q10 PO), Take 1 tablet by mouth daily.   GLUCOSAMINE-CHONDROITIN PO, Take 1 tablet by mouth daily.   Vitamin D, Ergocalciferol, (DRISDOL) 1.25 MG (50000 UNIT) CAPS capsule, Take 50,000 Units by mouth once a week.   dicyclomine  (BENTYL ) 10 MG capsule, Take 2 capsules (20 mg total) by mouth every 6 (six) hours as needed for spasms.   mesalamine  (CANASA ) 1000 MG suppository, Place 1 suppository (1,000 mg total) rectally at bedtime. (Patient not taking: Reported on 10/19/2023)   Multiple Vitamin (MULTIVITAMIN) tablet, Take 1 tablet by mouth daily.   ustekinumab  (STELARA ) 90 MG/ML SOSY injection, Inject 1 mL (90 mg total) into the skin every 8 (eight) weeks. (Patient not taking: Reported on 10/19/2023)  Medical History:  Past Medical History:  Diagnosis Date   Acute cystitis with hematuria 05/09/2016   ADD (attention deficit disorder)    Atrophic vaginitis 05/09/2016   Degenerative joint disease    Easy bruisability 05/09/2016   Goiter    H/O degenerative disc disease    Hypertension    Hypothyroidism    Increased frequency of urination 05/09/2016   Leg swelling    Ulcerative colitis ([facility])    Varicose veins    Women's annual routine gynecological examination 05/09/2016   Allergies: No Known Allergies   Surgical History:  She  has a past surgical  history that includes Total thyroidectomy (2008) and Cesarean section (4098,1191). Family History:  Her family history includes Breast cancer in her maternal grandmother; COPD in her mother; Emphysema in her mother; Heart Problems in her maternal grandfather; Testicular cancer in her brother.  REVIEW OF SYSTEMS  : All other systems reviewed and negative except where noted in the History of Present Illness.  PHYSICAL EXAM: BP 110/76   Pulse 84   Ht 5\' 7"  (1.702 m)   Wt 191 lb  12.8 oz (87 kg)   BMI 30.04 kg/m  Physical Exam   GENERAL APPEARANCE: Well nourished, in no apparent distress. HEENT: No cervical lymphadenopathy, unremarkable thyroid , sclerae anicteric, conjunctiva pink. RESPIRATORY: Respiratory effort normal, BS equal bilateral without rales, rhonchi, wheezing. CARDIO: RRR with no MRGs, peripheral pulses intact. ABDOMEN: Soft, non distended, active bowel sounds in all 4 quadrants, mild abdominal tenderness, no rebound tenderness, no mass appreciated. RECTAL: Blood present on rectal exam, no anal fissures or abscesses. MUSCULOSKELETAL: Full ROM, normal gait, without edema. SKIN: Dry, intact without rashes or lesions. No jaundice. NEURO: Alert, oriented, no focal deficits. PSYCH: Cooperative, normal mood and affect.        Edmonia Gottron, PA-C 3:44 PM

## 2023-10-19 NOTE — Patient Instructions (Addendum)
 Your provider has requested that you go to the basement level for lab work before leaving today. Press "B" on the elevator. The lab is located at the first door on the left as you exit the elevator. After you leave the lab, please go to the radiology department for your abdominal x-ray.  We have sent the following medications to your pharmacy for you to pick up at your convenience: dicyclomine  and prednisone  taper.   Please visit these websites below for more information: [email or web address]   Health Maintenance in Inflammatory Bowel Disease  Vaccines Inflammatory Bowel Disease (IBD) is often treated with immunomodulatory medications (6-mercaptopurine, azathioprine, methotrexate), biologic therapies (adalimumab, certulizomab, natalizumab, infliximab, vedolizumab) or chronic steroids (at least 10mg  daily for 3 months), which suppress the immune system.  Patients receiving this type of therapy are at higher risk to develop infections.  Many infections can be prevented by vaccinations. Inflammatory bowel disease within itself can cause inflammation and weaken the immune system.  Discuss vaccinations with your physician, but in general, IBD patients SHOULD receive the following vaccines:  Shingles vaccination Patient is at increased risk for immune compromised such as those with inflammatory bowel disease are able to get the Shingrix vaccine age 72 and up. 2 part shot, second shot between second and 71-month.  If you go over 6 months for the second shot have to restart series. Shingles is herpes virus dormant along the nerve root, can cause deafness, blindness and lasting pain.  Hepatitis A and B -We can test for this and able to get this in our office.   Human papilloma virus (HPV)  The HPV vaccine is recommended in females aged 66-45. It is a 2-dose vaccine.   The HPV vaccine is also now recommended in males aged  13-45  Influenza  All adults should get the influenza vaccine annually.  IBD patients on immunosuppressive therapy should NOT get the intranasal vaccine, as it is a live vaccine.  Pneumococcal  All adults 65 years or older should receive the Pneumococcal Conjugate Vaccine (PCV13), followed by the Pneumococcal Polysaccharide Vaccine (PPSV23) at least 1 year later.  In addition, patients over the age of 35 on immunosuppressive therapy should receive the PCV13, followed by PPSV23 no sooner than 8 weeks later.  A booster PPSV23 is also recommended 5 years later.  Finally, patients that smoke should get the PPSV23 if not otherwise vaccinated.  Tetanus, diphtheria, pertussis (Tdap/Td)  All adults should receive a 1-time Tdap.  A Td booster should be administered every 10 years.   Bone Health Patients with IBD can have markedly lower bone mineral densities than patients without IBD. Low bone mineral density is associated with fractures. Therefore, screening for bone disease, such as osteoporosis and osteopenia, is important in certain populations.  The American Gastroenterology Association (AGA) and the American College of Gastroenterology [facility]) recommend dual-energy x-ray absorptiometry scanning (DEXA) in postmenopausal women, men over the age of 71, patients with prolonged corticosteroid use (greater than 3 consecutive months or recurrent courses), patients with a personal history of low trauma fracture and patients with hypogonadism.   Tobacco Cessation Smoking worsens Crohn's disease. In addition, smoking is associated with many known cardiac, pulmonary and oncologic risks.   If you need help with quitting smoking, please talk to your doctor and call 1-800-QUIT NOW.  Cancer Screening IBD patients on immunosuppressive therapy may be more susceptible to certain types of cancer. However, specific evidence is conflicting. Therefore, patients with IBD should undergo age- and sex-specific cancer  screening. Please discuss this important issue with your primary care provider.  Colon Cancer  Patients with either Ulcerative Colitis (UC) or Crohn's disease involving the colon should have colon cancer screening 8-10 years after initial diagnosis. After that time, the frequency of surveillance colonocoscopy will be determined by your gastroenterologist, but will usually occur every 1-2 years.   Patients with IBD and Primary Sclerosing Cholangitis (PSC) need a screening colonoscopy at the time of diagnosis of PSC and annually thereafter.

## 2023-10-23 ENCOUNTER — Telehealth: Payer: Self-pay | Admitting: Physician Assistant

## 2023-10-23 ENCOUNTER — Ambulatory Visit: Payer: Self-pay | Admitting: Physician Assistant

## 2023-10-23 LAB — IBC + FERRITIN
Ferritin: 52.8 ng/mL (ref 10.0–291.0)
Iron: 28 ug/dL — ABNORMAL LOW (ref 42–145)
Saturation Ratios: 9.5 % — ABNORMAL LOW (ref 20.0–50.0)
TIBC: 294 ug/dL (ref 250.0–450.0)
Transferrin: 210 mg/dL — ABNORMAL LOW (ref 212.0–360.0)

## 2023-10-23 LAB — VITAMIN D 1,25 DIHYDROXY
Vitamin D 1, 25 (OH)2 Total: 19 pg/mL (ref 18–72)
Vitamin D2 1, 25 (OH)2: <8 pg/mL
Vitamin D3 1, 25 (OH)2: 19 pg/mL

## 2023-10-23 LAB — TIQ-MISC

## 2023-10-23 LAB — HEPATITIS B SURFACE ANTIGEN: Hepatitis B Surface Ag: NONREACTIVE

## 2023-10-23 NOTE — Progress Notes (Signed)
 Agree with the assessment and plan as outlined by Santina Cull, PA-C.  Unfortunately, despite excellent response to therapy and being in remission had to stop Stelara  due to insurance and will recovering, and now presents with flare.  Agree with plan to check for antibody to Stelara .  If antibody negative, recommend trying to get coverage through her insurance for Stelara  since this was efficacious.  If antibody positive or if insurance will not cover starting Stelara  again, I think Tremfya is a very reasonable option.  In the meantime, agree with labs as outlined and covering with steroids.  Anquinette Pierro, DO, [facility]

## 2023-10-23 NOTE — Telephone Encounter (Signed)
 GI stool negative for infection, patient notified with my chart.

## 2023-10-27 LAB — USTEKINUMAB AND ANTI-USTEK AB
Anti-Ustekinumab Antibody: <40 ng/mL
Ustekinumab: <0.1 ug/mL

## 2023-10-30 ENCOUNTER — Encounter: Payer: Self-pay | Admitting: Physician Assistant

## 2023-10-30 NOTE — Addendum Note (Signed)
 Addended by: Santina Cull on: 10/30/2023 09:52 AM   Modules accepted: Orders

## 2023-11-01 ENCOUNTER — Other Ambulatory Visit: Payer: Self-pay | Admitting: Internal Medicine

## 2023-11-01 DIAGNOSIS — Z1231 Encounter for screening mammogram for malignant neoplasm of breast: Secondary | ICD-10-CM | POA: Diagnosis not present

## 2023-11-01 DIAGNOSIS — E2839 Other primary ovarian failure: Secondary | ICD-10-CM | POA: Diagnosis not present

## 2023-11-01 DIAGNOSIS — F419 Anxiety disorder, unspecified: Secondary | ICD-10-CM | POA: Diagnosis not present

## 2023-11-01 DIAGNOSIS — Z1331 Encounter for screening for depression: Secondary | ICD-10-CM | POA: Diagnosis not present

## 2023-11-01 DIAGNOSIS — Z0000 Encounter for general adult medical examination without abnormal findings: Secondary | ICD-10-CM | POA: Diagnosis not present

## 2023-11-01 DIAGNOSIS — R209 Unspecified disturbances of skin sensation: Secondary | ICD-10-CM | POA: Diagnosis not present

## 2023-11-01 DIAGNOSIS — R519 Headache, unspecified: Secondary | ICD-10-CM

## 2023-11-01 DIAGNOSIS — R0981 Nasal congestion: Secondary | ICD-10-CM | POA: Diagnosis not present

## 2023-11-01 DIAGNOSIS — M0760 Enteropathic arthropathies, unspecified site: Secondary | ICD-10-CM | POA: Diagnosis not present

## 2023-11-01 DIAGNOSIS — Z6829 Body mass index (BMI) 29.0-29.9, adult: Secondary | ICD-10-CM | POA: Diagnosis not present

## 2023-11-01 DIAGNOSIS — R110 Nausea: Secondary | ICD-10-CM | POA: Diagnosis not present

## 2023-11-01 DIAGNOSIS — E890 Postprocedural hypothyroidism: Secondary | ICD-10-CM | POA: Diagnosis not present

## 2023-11-05 ENCOUNTER — Other Ambulatory Visit (HOSPITAL_COMMUNITY): Payer: Self-pay

## 2023-11-05 ENCOUNTER — Encounter: Payer: Self-pay | Admitting: Physician Assistant

## 2023-11-05 ENCOUNTER — Encounter: Payer: Self-pay | Admitting: Family

## 2023-11-05 ENCOUNTER — Telehealth: Payer: Self-pay

## 2023-11-05 ENCOUNTER — Ambulatory Visit: Payer: PRIVATE HEALTH INSURANCE | Admitting: Physician Assistant

## 2023-11-05 NOTE — Telephone Encounter (Signed)
 Pharmacy Patient Advocate Encounter   Received notification from Pt Calls Messages that prior authorization for Stelara  90MG /ML syringes is required/requested.   Insurance verification completed.   The patient is insured through [facility] .   Per test claim: PA required; PA submitted to above mentioned insurance via CoverMyMeds Key/confirmation #/EOC [id number] Status is pending

## 2023-11-06 ENCOUNTER — Telehealth: Payer: Self-pay

## 2023-11-06 NOTE — Telephone Encounter (Signed)
 Kristina Gentry, patient will be scheduled as soon as possible.  Auth Submission: APPROVED Site of care: Site of care: [facility] Payer: [employer] commercial Medication & CPT/J Code(s) submitted: Stelara  Infusion (Ustekinumab ) [id number] Route of submission (phone, fax, portal): portal Phone # Fax # Auth type: Buy/Bill PB Units/visits requested: 390mg  x 1 dose Reference number: [id number] Approval from: 10/30/23 to 10/28/24

## 2023-11-06 NOTE — Telephone Encounter (Signed)
 Hello, can we submit PA for Stelara  induction doses?

## 2023-11-06 NOTE — Telephone Encounter (Signed)
 Pharmacy Patient Advocate Encounter  Received notification from [facility] that Prior Authorization for Stelara  90MG /ML syringes has been APPROVED from 11-05-2023 to 11-04-2024   PA #/Case ID/Reference #: [id number]

## 2023-11-06 NOTE — Telephone Encounter (Signed)
 Noted, thank you

## 2023-11-09 ENCOUNTER — Other Ambulatory Visit: Payer: Self-pay

## 2023-11-09 ENCOUNTER — Telehealth: Payer: Self-pay | Admitting: Physician Assistant

## 2023-11-09 DIAGNOSIS — D500 Iron deficiency anemia secondary to blood loss (chronic): Secondary | ICD-10-CM

## 2023-11-09 NOTE — Telephone Encounter (Signed)
 Inbound call from patient, states she would like to proceed in scheduling iron  infusions instead of taking iron  medication.

## 2023-11-09 NOTE — Telephone Encounter (Signed)
 Spoke with patient & she feels that she is not improving on the oral iron , still feels fatigued and requesting infusions. Referral placed to hematology for iron  infusions & patient is aware they will contact her in the next couple of weeks.

## 2023-11-09 NOTE — Telephone Encounter (Signed)
 Patient called back to see if she needed induction dose since she's had stelera previously, however it has been two years. Advised her she would need to start the process over as ordered. She also wanted it noted for future reference if needed for [employer] [employer] Card:  Group #: [id number] Member ID: [id number] Bin: [id number]

## 2023-11-15 ENCOUNTER — Other Ambulatory Visit: Payer: Self-pay | Admitting: Physician Assistant

## 2023-11-15 DIAGNOSIS — K51919 Ulcerative colitis, unspecified with unspecified complications: Secondary | ICD-10-CM

## 2023-11-15 DIAGNOSIS — R197 Diarrhea, unspecified: Secondary | ICD-10-CM

## 2023-11-15 DIAGNOSIS — D500 Iron deficiency anemia secondary to blood loss (chronic): Secondary | ICD-10-CM

## 2023-11-15 MED ORDER — PREDNISONE 10 MG PO TABS: ORAL_TABLET | ORAL | 0 refills | Status: AC

## 2023-11-15 NOTE — Addendum Note (Signed)
 Addended by: Santina Cull on: 11/15/2023 03:35 PM   Modules accepted: Orders

## 2023-11-19 ENCOUNTER — Telehealth: Payer: Self-pay

## 2023-11-19 NOTE — Telephone Encounter (Addendum)
 Auth Submission: APPROVED Site of care: Site of care: [facility] Payer: [employer] commercial Medication & CPT/J Code(s) submitted: Stelara  Infusion (Ustekinumab ) [id number] Diagnosis Code:  Route of submission (phone, fax, portal): portal Phone # Fax # Auth type: Buy/Bill PB Units/visits requested: 520mg  x 1 dose Reference number: [id number] Approval from: 10/30/23 to 10/28/24   I confirmed with [employer] that the patient is able to get this at [facility] site. Does not need to come from [facility] ref #[id number] medical buy/bill

## 2023-11-19 NOTE — Addendum Note (Signed)
 Addended by: Santina Cull on: 11/19/2023 09:17 AM   Modules accepted: Orders

## 2023-11-20 ENCOUNTER — Ambulatory Visit

## 2023-11-20 ENCOUNTER — Ambulatory Visit (INDEPENDENT_AMBULATORY_CARE_PROVIDER_SITE_OTHER): Admitting: *Deleted

## 2023-11-20 VITALS — BP 128/77 | HR 85 | Temp 98.8°F | Resp 12 | Ht 67.0 in | Wt 190.0 lb

## 2023-11-20 DIAGNOSIS — K5100 Ulcerative (chronic) pancolitis without complications: Secondary | ICD-10-CM

## 2023-11-20 MED ORDER — USTEKINUMAB 130 MG/26ML IV SOLN
520.0000 mg | Freq: Once | INTRAVENOUS | Status: AC
  Administered 2023-11-20: 520 mg via INTRAVENOUS
  Filled 2023-11-20: qty 104

## 2023-11-20 NOTE — Telephone Encounter (Signed)
 Stelara  copay card:  BIN: [id number] Group: [id number] ID: [id number]

## 2023-11-20 NOTE — Progress Notes (Signed)
 Diagnosis: Ulcerative Colitis  Provider:  Phyllis Breeze MD  Procedure: IV Infusion  IV Type: Peripheral, IV Location: R Antecubital  Stelera (Ustekinumab ), Dose:   520 mg   Infusion Start Time: 1229  Infusion Stop Time: 1338  Post Infusion IV Care: Observation period completed  Discharge: Condition: Good, Destination: Home . AVS Provided  Performed by:  Devonne Folk, RN

## 2023-11-21 ENCOUNTER — Other Ambulatory Visit

## 2023-11-23 ENCOUNTER — Telehealth: Payer: Self-pay | Admitting: *Deleted

## 2023-11-23 ENCOUNTER — Telehealth: Payer: Self-pay | Admitting: Physician Assistant

## 2023-11-23 NOTE — Telephone Encounter (Signed)
 Pt is calling to request Iron  Orders be sent to [facility] to due the cost with her insurance. Pt stated that at the [facility] center that she will have to pay 50 percent of the price of the iron  infusion. Pt stated that at the [facility] she will pay $80 dollars for the speciality visit and the the cost of the Iron .   Please review and advise.

## 2023-11-23 NOTE — Telephone Encounter (Signed)
 Patient called and stated that she was referred to get in Iron  Infusion done at the [place] [facility],  but she has still not received a call. Patient is requesting that we rewrite the referral and send it over to [facility], patient stated that the they have lower rates that her insurance can cover. Patient stated that she is still having bleeding. Please advise.

## 2023-11-23 NOTE — Telephone Encounter (Signed)
 See MyChart note.

## 2023-11-26 MED ORDER — SODIUM CHLORIDE 0.9 % IV SOLN: 200.0000 mg | INTRAVENOUS | 0 refills | Status: AC

## 2023-11-26 NOTE — Telephone Encounter (Signed)
 Yes please. Please route back to me after orders are entered and I will make the pt aware.  Thanks

## 2023-11-26 NOTE — Telephone Encounter (Signed)
 I do not know the preferred. I think it would be good to proceed with venofer .

## 2023-11-26 NOTE — Addendum Note (Signed)
 Addended by: CRAIG PALMA on: 11/26/2023 10:16 AM   Modules accepted: Orders

## 2023-11-27 NOTE — Telephone Encounter (Signed)
 [facility] form Completed by Alan Coombs for IV iron . Form faxed to [facility].  Pt made aware. Pt verbalized understanding with all questions answered.

## 2023-11-30 NOTE — Telephone Encounter (Signed)
 Inbound call from megan with [facility]. States they will refax form due to missing information.   Also requesting last providers notes as well as any tried or fail therapy notes?   Also requesting lab work and plan of treatment form.   Will fax form to [phone].

## 2023-11-30 NOTE — Telephone Encounter (Signed)
 Spoke with Ronal Hun with [facility]. Requested that fax be sent to. [phone]. Awaiting fax to be sent to make corrections and resend additional notes.

## 2023-12-03 NOTE — Telephone Encounter (Signed)
 Fax received.  Order filled out along with additional records faxed over to [facility].

## 2023-12-05 NOTE — Telephone Encounter (Signed)
 Spoke with Kristina Gentry from [facility] who stated that they had not received the fax. Kristina Gentry was notified that the fax was resent yesterday and conformation page received that it was sent successfully. Kristina Gentry was notified that I would fax it again.

## 2023-12-05 NOTE — Telephone Encounter (Signed)
 Inbound call from Meghan from [facility] stating they have not received new order or additional records. Requesting a call back at [phone]. Please advise, thank you

## 2023-12-06 ENCOUNTER — Other Ambulatory Visit: Payer: Self-pay | Admitting: Internal Medicine

## 2023-12-06 DIAGNOSIS — Z1231 Encounter for screening mammogram for malignant neoplasm of breast: Secondary | ICD-10-CM

## 2023-12-10 ENCOUNTER — Ambulatory Visit
Admission: RE | Admit: 2023-12-10 | Discharge: 2023-12-10 | Disposition: A | Discharge status: Home / Self Care | Source: Ambulatory Visit | Attending: Internal Medicine | Admitting: Internal Medicine

## 2023-12-10 DIAGNOSIS — Z1231 Encounter for screening mammogram for malignant neoplasm of breast: Secondary | ICD-10-CM

## 2023-12-11 ENCOUNTER — Other Ambulatory Visit: Payer: Self-pay | Admitting: Physician Assistant

## 2023-12-11 DIAGNOSIS — K51919 Ulcerative colitis, unspecified with unspecified complications: Secondary | ICD-10-CM

## 2023-12-11 DIAGNOSIS — R197 Diarrhea, unspecified: Secondary | ICD-10-CM

## 2023-12-11 NOTE — Telephone Encounter (Signed)
 Kristina Gentry from [facility] called and stated that they have not received the fax. Kristina Gentry stated that her best contact number would be [phone]. Please advise.

## 2023-12-12 NOTE — Telephone Encounter (Signed)
 Left message for Kristina Gentry to call back.

## 2023-12-13 NOTE — Telephone Encounter (Addendum)
 Spoke with Kristina Gentry with [facility], she is requesting a second diagnostic code on order form. UC dx code added & faxed back. Success form received.

## 2023-12-17 ENCOUNTER — Other Ambulatory Visit: Payer: Self-pay

## 2023-12-17 MED ORDER — USTEKINUMAB 90 MG/ML ~~LOC~~ SOSY
90.0000 mg | PREFILLED_SYRINGE | SUBCUTANEOUS | 5 refills | Status: DC
Start: 2023-12-17 — End: 2023-12-17

## 2023-12-17 MED ORDER — USTEKINUMAB 90 MG/ML ~~LOC~~ SOSY
90.0000 mg | PREFILLED_SYRINGE | SUBCUTANEOUS | 5 refills | Status: DC
Start: 2023-12-17 — End: 2024-04-02

## 2023-12-17 NOTE — Telephone Encounter (Signed)
 Re[facility] requesting [facility] for Stelara  90 mg every 8 weeks. New prescription has been e-prescribed.

## 2023-12-17 NOTE — Addendum Note (Signed)
 Addended by: Ragena Fiola N on: 12/17/2023 03:16 PM   Modules accepted: Orders

## 2024-01-04 ENCOUNTER — Encounter: Payer: Self-pay | Admitting: Internal Medicine

## 2024-01-11 ENCOUNTER — Telehealth: Payer: Self-pay

## 2024-01-11 NOTE — Telephone Encounter (Signed)
 Faxed received from [facility] Requesting additional information for the pt Kristina Gentry.  Pt was contacted and questioned has she received her iron  infusion prior. Pt stated that she has not received her iron  infusion  and she does not want to proceed with the iron  infusions. Pt stated that she recently started receiving her Stelara  infusions  and she is not bleeding anymore and feels much better. Routed as FYI

## 2024-01-15 ENCOUNTER — Ambulatory Visit
Admission: RE | Admit: 2024-01-15 | Discharge: 2024-01-15 | Disposition: A | Discharge status: Home / Self Care | Source: Ambulatory Visit | Attending: Internal Medicine | Admitting: Internal Medicine

## 2024-01-15 DIAGNOSIS — R519 Headache, unspecified: Secondary | ICD-10-CM

## 2024-01-15 DIAGNOSIS — G44209 Tension-type headache, unspecified, not intractable: Secondary | ICD-10-CM | POA: Diagnosis not present

## 2024-01-15 DIAGNOSIS — R209 Unspecified disturbances of skin sensation: Secondary | ICD-10-CM

## 2024-01-15 MED ORDER — GADOPICLENOL 0.5 MMOL/ML IV SOLN
9.0000 mL | Freq: Once | INTRAVENOUS | Status: AC | PRN
  Administered 2024-01-15 (×2): 9 mL via INTRAVENOUS

## 2024-01-16 ENCOUNTER — Ambulatory Visit: Admitting: Gastroenterology

## 2024-01-25 ENCOUNTER — Ambulatory Visit: Admitting: Gastroenterology

## 2024-02-06 DIAGNOSIS — R5383 Other fatigue: Secondary | ICD-10-CM | POA: Diagnosis not present

## 2024-02-06 DIAGNOSIS — R41840 Attention and concentration deficit: Secondary | ICD-10-CM | POA: Diagnosis not present

## 2024-02-06 DIAGNOSIS — E669 Obesity, unspecified: Secondary | ICD-10-CM | POA: Diagnosis not present

## 2024-02-06 DIAGNOSIS — I10 Essential (primary) hypertension: Secondary | ICD-10-CM | POA: Diagnosis not present

## 2024-02-06 DIAGNOSIS — G500 Trigeminal neuralgia: Secondary | ICD-10-CM | POA: Diagnosis not present

## 2024-02-06 DIAGNOSIS — F419 Anxiety disorder, unspecified: Secondary | ICD-10-CM | POA: Diagnosis not present

## 2024-02-06 DIAGNOSIS — K51918 Ulcerative colitis, unspecified with other complication: Secondary | ICD-10-CM | POA: Diagnosis not present

## 2024-02-06 DIAGNOSIS — M0760 Enteropathic arthropathies, unspecified site: Secondary | ICD-10-CM | POA: Diagnosis not present

## 2024-02-12 DIAGNOSIS — J069 Acute upper respiratory infection, unspecified: Secondary | ICD-10-CM | POA: Diagnosis not present

## 2024-02-12 DIAGNOSIS — R051 Acute cough: Secondary | ICD-10-CM | POA: Diagnosis not present

## 2024-03-04 ENCOUNTER — Other Ambulatory Visit: Payer: Self-pay | Admitting: Internal Medicine

## 2024-03-04 DIAGNOSIS — R209 Unspecified disturbances of skin sensation: Secondary | ICD-10-CM

## 2024-03-04 DIAGNOSIS — R519 Headache, unspecified: Secondary | ICD-10-CM

## 2024-03-05 DIAGNOSIS — M25579 Pain in unspecified ankle and joints of unspecified foot: Secondary | ICD-10-CM | POA: Insufficient documentation

## 2024-03-13 DIAGNOSIS — H9201 Otalgia, right ear: Secondary | ICD-10-CM | POA: Diagnosis not present

## 2024-04-02 ENCOUNTER — Encounter: Payer: Self-pay | Admitting: Gastroenterology

## 2024-04-02 ENCOUNTER — Ambulatory Visit: Admitting: Gastroenterology

## 2024-04-02 ENCOUNTER — Telehealth: Payer: Self-pay

## 2024-04-02 VITALS — BP 120/70 | HR 75 | Ht 67.0 in | Wt 195.1 lb

## 2024-04-02 DIAGNOSIS — K50119 Crohn's disease of large intestine with unspecified complications: Secondary | ICD-10-CM

## 2024-04-02 DIAGNOSIS — K5190 Ulcerative colitis, unspecified, without complications: Secondary | ICD-10-CM

## 2024-04-02 DIAGNOSIS — Z860100 Personal history of colon polyps, unspecified: Secondary | ICD-10-CM

## 2024-04-02 DIAGNOSIS — Z860101 Personal history of adenomatous and serrated colon polyps: Secondary | ICD-10-CM

## 2024-04-02 DIAGNOSIS — D509 Iron deficiency anemia, unspecified: Secondary | ICD-10-CM

## 2024-04-02 MED ORDER — NA SULFATE-K SULFATE-MG SULF 17.5-3.13-1.6 GM/177ML PO SOLN: 1.0000 | ORAL | 0 refills | Status: AC

## 2024-04-02 MED ORDER — USTEKINUMAB 90 MG/ML ~~LOC~~ SOSY: 90.0000 mg | PREFILLED_SYRINGE | SUBCUTANEOUS | Status: DC

## 2024-04-02 NOTE — Progress Notes (Unsigned)
 Chief Complaint:    Ulcerative Colitis, abdominal pain  IBD history: Diagnosed 1993 after flexible sigmoidoscopy, treated with sulfasalazine. Was then hospitalized 1996, treated with Prednisone  and changed to Asacol .  Eventually transitioned to Imuran, without good clinical relief requiring another course of steroids (was deemed steroid dependent).  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  Was seen by Rheumatology who increased Humira to weekly. Was in reported deep remission for years, and stopped Humira on her own approx 2015.  Had been in remission since then until her flare in 04/2019.   Started Entyvio 10/2019, but ongoing symptoms despite adequate trial and negative Ab (primary nonresponder).   Transition to Stelara  03/2020 with excellent clinical response along with resolution of arthropathies.  Unfortunately, this was stopped due to loss of insurance.   Last colonoscopy: 08/2019, was not on medication at that time and responsive to Entyvio 10/2019 started on Stelara  03/2020 4 mm cecal TA, sigmoid pseudopolyps, continuous female due to inflammation from the anus to sigmoid, with transition at 55 cm, peri-AO inflammation c/w cecal patch.  Normal TI Last small bowel imaging: 2003 Extraintestinal manifestations: The patient has not had any extraintestinal symptoms Surgical history: no surgery.  Other significant medical history: IDA 11/22/2020 Iron  51 Ferritin 142  Osteopenia scheduled DEXA with MGM end of June  Endoscopic History: -Colonoscopy (08/2019, Dr. San): 4 mm cecal TA, sigmoid pseudopolyps, continuous female due to inflammation from the anus to sigmoid, with transition at 55 cm, peri-AO inflammation c/w cecal patch.  Normal TI -Colonoscopy (2016, [facility]): No active disease  HPI:     Patient is a 63 y.o. female presenting to the Gastroenterology  Clinic for follow-up.  Was last seen by Alan Coombs on 10/19/2023.  Was having active symptoms of increased stool frequency, diarrhea/loose stools, bloody mucus-like stools, abdominal pain, and return of arthralgias, starting approximately 2 weeks after discontinuation of Stelara  due to insurance issues and had been slowly progressive throughout the year.  Elevated CRP at 6.  Stelara  antibody negative and submitted to insurance for approval to restart Stelara  IV, and if not, plan was to move to Tremfya or [employer].  Stelara  was approved by her insurance and has restarted induction phase in June.  Additionally, referred to Hematology for iron  deficiency, but she never completed and instead used oral iron  for a short period of time.   Labs completed by PCP on 10/23 and I personally reviewed on her phone today: - H/H 12.7/40.4 with MCV/RDW 95.7/12.8 - PLT 326 - Normal CMP - Ferritin 42, iron  64, TIBC, 328, sat 20% - CRP <3  Has completed Stelara  induction phase and now at 90 mg every 8 weeks and feeling much better.   Will have mild breakthrough sxs at week 6-7, mostly low back pain and fatigue, but no diarrhea or mucus like stools.    Review of systems:     No chest pain, no SOB, no fevers, no urinary sx   Past Medical History:  Diagnosis Date   Acute cystitis with hematuria 05/09/2016   ADD (attention deficit disorder)    Atrophic vaginitis 05/09/2016   Degenerative joint disease    Easy bruisability 05/09/2016   Goiter    H/O degenerative disc disease    Hypertension    Hypothyroidism    Increased frequency of urination 05/09/2016   Leg swelling    Ulcerative colitis (HCC)  Varicose veins    Women's annual routine gynecological examination 05/09/2016    Patient's surgical history, family medical history, social history, medications and allergies were all reviewed in Epic    Current Outpatient Medications  Medication Sig Dispense Refill   ALPRAZolam (XANAX) 0.5 MG tablet Take 0.5  mg by mouth 2 (two) times daily as needed for sleep.     amphetamine-dextroamphetamine (ADDERALL XR) 30 MG 24 hr capsule Take 30 mg by mouth every morning.     Ascorbic Acid 500 MG CHEW Chew 500 mg by mouth daily.     B Complex Vitamins (VITAMIN B COMPLEX PO) Take 1 capsule by mouth daily.     buPROPion (WELLBUTRIN XL) 300 MG 24 hr tablet Take 300 mg by mouth daily.      Calcium Citrate-Vitamin D  (CALCIUM + D PO) Take 1 tablet by mouth 2 (two) times daily.     Coenzyme Q10 (CO Q10 PO) Take 1 tablet by mouth daily.     dicyclomine  (BENTYL ) 10 MG capsule Take 2 capsules (20 mg total) by mouth every 6 (six) hours as needed for spasms. 60 capsule 3   GLUCOSAMINE-CHONDROITIN PO Take 1 tablet by mouth daily.     HYDROcodone -acetaminophen  (NORCO/VICODIN) 5-325 MG tablet Take 1 tablet by mouth every 6 (six) hours as needed for moderate pain. 18 tablet 0   levothyroxine (SYNTHROID) 200 MCG tablet Take 200 mcg by mouth daily.     lisinopril (PRINIVIL,ZESTRIL) 10 MG tablet Take 20 mg by mouth daily as needed. (Patient taking differently: Take 5 mg by mouth daily as needed.)     mesalamine  (CANASA ) 1000 MG suppository Place 1 suppository (1,000 mg total) rectally at bedtime. 30 suppository 5   Multiple Vitamin (MULTIVITAMIN) tablet Take 1 tablet by mouth daily.     ustekinumab  (STELARA ) 90 MG/ML SOSY injection Inject 1 mL (90 mg total) into the skin every 8 (eight) weeks. 1 mL 5   Vitamin D , Ergocalciferol , (DRISDOL) 1.25 MG (50000 UNIT) CAPS capsule Take 50,000 Units by mouth once a week.     No current facility-administered medications for this visit.    Physical Exam:     BP 120/70   Pulse 75   Ht 5' 7 (1.702 m)   Wt 195 lb 2 oz (88.5 kg)   BMI 30.56 kg/m   GENERAL:  Pleasant female in NAD NEURO: Alert and oriented x 3, no focal neurologic deficits   IMPRESSION and PLAN:    1) Ulcerative Colitis 63 year old female with steroid responsive Ulcerative Colitis (pancolitis), with good  response to Stelara .  Now describing some mild breakthrough type symptoms at week 6-7 of her treatment cycle.  - Change Stelara  to every 6 weeks - Will use fecal calprotectin for intermittent monitoring - Can check ustekinumab  trough and antibody level about 3 months after change in treatment interval for proactive monitoring - Annual skin check with dermatology - Colonoscopy in December for surveillance and assess response to therapy - UTD on vaccines.  Flu vaccine when available  2) Iron  deficiency anemia Presumably secondary to ulcerative colitis.  Has received IV iron  in the past, but most recent CBC and iron  panel improved on labs last week without IV iron .  Does not tolerate oral iron  very well. - Check CBC and iron  panel in 3-6 months for continued monitoring  3) History of colon polyps - Due for repeat colonoscopy for continued surveillance - Schedule colonoscopy as well  RTC in 6 months or sooner as needed  I  spent 30 minutes of time, including in depth chart review, independent review of results as outlined above, communicating results with the patient directly, face-to-face time with the patient, coordinating care, and ordering studies and medications as appropriate, and documentation.     Sandor LULLA Flatter ,DO, FACG 04/02/2024, 9:06 AM

## 2024-04-02 NOTE — Telephone Encounter (Signed)
 Order for Stelara  90 mg every 6 weeks entered in epic.

## 2024-04-02 NOTE — Patient Instructions (Addendum)
 _______________________________________________________  If your blood pressure at your visit was 140/90 or greater, please contact your primary care physician to follow up on this.  _______________________________________________________  If you are age 62 or older, your body mass index should be between 23-30. Your Body mass index is 30.56 kg/m. If this is out of the aforementioned range listed, please consider follow up with your Primary Care Provider.  If you are age 16 or younger, your body mass index should be between 19-25. Your Body mass index is 30.56 kg/m. If this is out of the aformentioned range listed, please consider follow up with your Primary Care Provider.   ________________________________________________________  The [facility] providers would like to encourage you to use MYCHART to communicate with providers for non-urgent requests or questions.  Due to long hold times on the telephone, sending your provider a message by [facility] may be a faster and more efficient way to get a response.  Please allow 48 business hours for a response.  Please remember that this is for non-urgent requests.  _______________________________________________________  [facility] is using a team-based approach to care.  Your team is made up of your doctor and two to three APPS. Our APPS (Nurse Practitioners and Physician Assistants) work with your physician to ensure care continuity for you. They are fully qualified to address your health concerns and develop a treatment plan. They communicate directly with your gastroenterologist to care for you. Seeing the Advanced Practice Practitioners on your physician's team can help you by facilitating care more promptly, often allowing for earlier appointments, access to diagnostic testing, procedures, and other specialty referrals.   You have been scheduled for a colonoscopy. Please follow written instructions given to you at your visit today.    If you use inhalers (even only as needed), please bring them with you on the day of your procedure.  DO NOT TAKE 7 DAYS PRIOR TO TEST- Trulicity (dulaglutide) Ozempic, Wegovy (semaglutide) Mounjaro (tirzepatide) Bydureon Bcise (exanatide extended release)  DO NOT TAKE 1 DAY PRIOR TO YOUR TEST Rybelsus (semaglutide) Adlyxin (lixisenatide) Victoza (liraglutide) Byetta (exanatide) ___________________________________________________________________________  Stelara  decrease dose to every 6 weeks.  Due to recent changes in healthcare laws, you may see the results of your imaging and laboratory studies on MyChart before your provider has had a chance to review them.  We understand that in some cases there may be results that are confusing or concerning to you. Not all laboratory results come back in the same time frame and the provider may be waiting for multiple results in order to interpret others.  Please give us  48 hours in order for your provider to thoroughly review all the results before contacting the office for clarification of your results.   It was a pleasure to see you today!  Vito Cirigliano, D.O.

## 2024-04-02 NOTE — Telephone Encounter (Signed)
 Please submit new PA for updated Stelara  prescription. Thanks

## 2024-04-02 NOTE — Telephone Encounter (Signed)
-----   Message from [facility] R sent at 04/02/2024  9:38 AM EDT ----- Regarding: Stelara  Dose Dr San would like to decrease Stelara  dose to every 6 weeks instead of every 8 weeks.

## 2024-04-03 ENCOUNTER — Telehealth: Payer: Self-pay

## 2024-04-03 ENCOUNTER — Other Ambulatory Visit (HOSPITAL_COMMUNITY): Payer: Self-pay

## 2024-04-03 MED ORDER — USTEKINUMAB 90 MG/ML ~~LOC~~ SOSY: 90.0000 mg | PREFILLED_SYRINGE | SUBCUTANEOUS | 7 refills | Status: DC

## 2024-04-03 NOTE — Telephone Encounter (Signed)
 [facility] Patient notified via MyChart.

## 2024-04-03 NOTE — Telephone Encounter (Signed)
 [facility], patient notified via MyChart.

## 2024-04-03 NOTE — Telephone Encounter (Signed)
  Pharmacy Patient Advocate Encounter   Received notification from Pt Calls Messages that prior authorization for Stelara  90MG /ML syringes is required/requested.   Insurance verification completed.   The patient is insured through [facility].     PA #/Case ID/Reference #: [id number]

## 2024-04-03 NOTE — Telephone Encounter (Signed)
 Pharmacy Patient Advocate Encounter   Received notification from Pt Calls Messages that prior authorization for Stelara  90MG /ML syringes is required/requested.   Insurance verification completed.   The patient is insured through [facility].     PA #/Case ID/Reference #: [id number]

## 2024-04-09 DIAGNOSIS — R233 Spontaneous ecchymoses: Secondary | ICD-10-CM | POA: Diagnosis not present

## 2024-04-09 DIAGNOSIS — M25572 Pain in left ankle and joints of left foot: Secondary | ICD-10-CM | POA: Diagnosis not present

## 2024-04-09 DIAGNOSIS — L814 Other melanin hyperpigmentation: Secondary | ICD-10-CM | POA: Diagnosis not present

## 2024-04-09 DIAGNOSIS — D225 Melanocytic nevi of trunk: Secondary | ICD-10-CM | POA: Diagnosis not present

## 2024-04-09 DIAGNOSIS — L821 Other seborrheic keratosis: Secondary | ICD-10-CM | POA: Diagnosis not present

## 2024-04-10 DIAGNOSIS — M8589 Other specified disorders of bone density and structure, multiple sites: Secondary | ICD-10-CM | POA: Diagnosis not present

## 2024-04-10 DIAGNOSIS — E2839 Other primary ovarian failure: Secondary | ICD-10-CM | POA: Diagnosis not present

## 2024-04-11 ENCOUNTER — Other Ambulatory Visit

## 2024-04-11 ENCOUNTER — Other Ambulatory Visit: Payer: Self-pay

## 2024-04-11 DIAGNOSIS — M0760 Enteropathic arthropathies, unspecified site: Secondary | ICD-10-CM | POA: Insufficient documentation

## 2024-04-14 NOTE — Progress Notes (Unsigned)
 Cardiology Office Note:    Date:  04/15/2024   ID:  Kristina Gentry, DOB 12-12-1960, MRN [id number]  PCP:  Jefferey Fitch, MD  Cardiologist:  Redell Leiter, MD   Referring MD: Jefferey Fitch, MD  ASSESSMENT:    1. Chest pain of uncertain etiology   2. Precordial pain   3. Elevated lipoprotein(a)    PLAN:    In order of problems listed above:  Having chest pain high risk with her ulcerative colitis and hyperlipidemia with elevated LP(a) further evaluation cardiac CTA and vascular screen Initiate lipid-lowering therapy  Next appointment 3 months   Medication Adjustments/Labs and Tests Ordered: Current medicines are reviewed at length with the patient today.  Concerns regarding medicines are outlined above.  Orders Placed This Encounter  Procedures   CT CORONARY MORPH W/CTA COR W/SCORE W/CA W/CM &/OR WO/CM   Basic Metabolic Panel (BMET)   EKG 12-Lead   VAS US  VASCUSCREEN   Meds ordered this encounter  Medications   metoprolol tartrate (LOPRESSOR) 100 MG tablet    Sig: Take 1 tablet (100 mg total) by mouth once for 1 dose. Please take this medication 2 hours before CT.    Dispense:  1 tablet    Refill:  0     No chief complaint on file.   History of Present Illness:    Kristina Gentry is a 63 y.o. female with ulcerative colitis and hypertension being seen today for the evaluation of chest pain at the request of Jefferey Fitch, MD.  She has had a remote CT of the abdomen without atherosclerosis and most recently had a CT of the chest pulmonary embolism protocol 2018 which showed normal cardiovascular structures and no incidental finding of atherosclerosis.  I know her well from taking care of her husband. She has a history of ulcerative colitis currently in remission with disease modifying therapy Recently found to have elevated LP(a) her value was 199 and a lipid profile total cholesterol 2 9 LDL 132 and her non-HDL cholesterol quite elevated at  150. She has had some symptoms of chest pressure not exertional but recurrent not severe and she has new shortness of breath with physical activity Clearly is in a high risk group for CAD and will undergo a cardiac CTA. While initiate lipid-lowering therapy with bempedoic acid.  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   Goiter    H/O degenerative disc disease    Hypertension    Hypothyroidism    IDA (iron  deficiency anemia) 04/08/2020   Immunodeficiency due to drugs 02/14/2023   Increased frequency of urination 05/09/2016   Leg swelling    Osteoarthritis of acromioclavicular joint 11/24/2021   Osteopenia of multiple sites 05/06/2018   Right ear pain 03/13/2024   Ulcerative colitis (HCC)    Women's annual routine gynecological examination 05/09/2016    Past Surgical History:  Procedure Laterality Date   CESAREAN  SECTION  8004,7998   TOTAL THYROIDECTOMY  2008    Current Medications: Current Meds  Medication Sig   ALPRAZolam (XANAX) 0.5 MG tablet Take 0.5 mg by mouth 2 (two) times daily as needed for sleep.   amphetamine-dextroamphetamine (ADDERALL XR) 30 MG 24 hr capsule Take 30 mg by mouth every morning.   Ascorbic Acid 500 MG CHEW Chew 500 mg by mouth daily.   B Complex Vitamins (VITAMIN B COMPLEX PO) Take 1 capsule by mouth daily.   buPROPion (WELLBUTRIN XL) 300 MG 24 hr tablet Take 300 mg by mouth  daily.    Calcium Citrate-Vitamin D  (CALCIUM + D PO) Take 1 tablet by mouth 2 (two) times daily.   Coenzyme Q10 (CO Q10 PO) Take 1 tablet by mouth daily.   dicyclomine  (BENTYL ) 10 MG capsule Take 2 capsules (20 mg total) by mouth every 6 (six) hours as needed for spasms.   fluticasone (FLONASE) 50 MCG/ACT nasal spray Place 2 sprays into both nostrils as needed.   GLUCOSAMINE-CHONDROITIN PO Take 1 tablet by mouth daily.   HYDROcodone -acetaminophen  (NORCO/VICODIN) 5-325 MG tablet Take 1 tablet by mouth every 6 (six) hours as needed for moderate pain.   levothyroxine (SYNTHROID) 200 MCG tablet Take 200 mcg by mouth daily.   lisinopril (ZESTRIL) 5 MG tablet Take 5 mg by mouth.  1 TO 2 TABLETS ORALLY ONCE A DAY DEPENDING ON BLOOD PRESSURE READING 90 DAYS   mesalamine  (CANASA ) 1000 MG suppository Place 1 suppository (1,000 mg total) rectally at bedtime.   metFORMIN (GLUCOPHAGE-XR) 500 MG 24 hr tablet Take 500 mg by mouth daily with breakfast.   metoprolol tartrate (LOPRESSOR) 100 MG tablet Take 1 tablet (100 mg total) by mouth once for 1 dose. Please take this medication 2 hours before CT.   Multiple Vitamin (MULTIVITAMIN) tablet Take 1 tablet by mouth daily.   Na Sulfate-K Sulfate-Mg Sulfate concentrate (SUPREP BOWEL PREP KIT) 17.5-3.13-1.6 GM/177ML SOLN Take 1 kit (354 mLs total) by mouth as directed.   ustekinumab  (STELARA ) 90 MG/ML SOSY injection Inject 1 mL (90 mg total) into the skin every 6 (six) weeks.   Vitamin D , Ergocalciferol , (DRISDOL) 1.25 MG (50000 UNIT) CAPS capsule Take 50,000 Units by mouth once a week.     Allergies:   Patient has no known allergies.   Social History   Socioeconomic History   Marital status: Married    Spouse name: Not on file   Number of children: 2   Years of education: Not on file   Highest education level: Not on file  Occupational History   Not on file  Tobacco Use   Smoking status: Never   Smokeless tobacco: Never  Vaping Use   Vaping status:  Never Used  Substance and Sexual Activity   Alcohol use: Yes    Comment: rare   Drug use: No   Sexual activity: Not on file  Other Topics Concern   Not on file  Social History Narrative   Not on file   Social Drivers of Health   Financial Resource Strain: Not on file  Food Insecurity: Low Risk  (04/08/2024)   Received from [facility]   Hunger Vital Sign    Within the past 12 months, you worried that your food would run out before you got money to buy more: Never true    Within the past 12 months, the food you bought just didn't last and you didn't have money to get more. : Never true  Transportation Needs: No  Transportation Needs (04/08/2024)   Received from [facility]    In the past 12 months, has lack of reliable transportation kept you from medical appointments, meetings, work or from getting things needed for daily living? : No  Physical Activity: Not on file  Stress: Not on file  Social Connections: Unknown (10/18/2021)   [facility] from [facility]   Social Network    Social Network: Not on file     Family History: The patient's family history includes Breast cancer in her maternal grandmother; COPD in her mother; Emphysema in her mother; Heart Problems in her maternal grandfather; Testicular cancer in her brother. There is no history of Esophageal cancer, Pancreatic cancer, or Stomach cancer.  ROS:   ROS Please see the history of present illness.     All other systems reviewed and are negative.  EKGs/Labs/Other Studies Reviewed:    The following studies were reviewed today:  EKG Interpretation Date/Time:  Tuesday April 15 2024 08:50:11 EST Ventricular Rate:  88 PR Interval:  152 QRS Duration:  74 QT Interval:  356 QTC Calculation: 430 R Axis:   82  Text Interpretation: Normal sinus rhythm Normal ECG When compared with ECG of 28-Feb-2002 03:09, No significant change was found Confirmed by Monetta Rogue ([id number]) on 04/15/2024 8:53:28 AM     Physical Exam:    VS:  BP 122/78   Pulse 88   Ht 5' 7 (1.702 m)   Wt 190 lb 12.8 oz (86.5 kg)   SpO2 98%   BMI 29.88 kg/m     Wt Readings from Last 3 Encounters:  04/15/24 190 lb 12.8 oz (86.5 kg)  04/02/24 195 lb 2 oz (88.5 kg)  11/20/23 190 lb (86.2 kg)     GEN: She has no xanthoma or xanthelasma appears healthy well nourished, well developed in no acute distress HEENT: Normal NECK: No JVD; No carotid bruits LYMPHATICS: No lymphadenopathy CARDIAC: RRR, no murmurs, rubs, gallops RESPIRATORY:  Clear to auscultation without rales, wheezing or rhonchi  ABDOMEN: Soft, non-tender, non-distended MUSCULOSKELETAL:  No edema; No deformity  SKIN: Warm and dry NEUROLOGIC:  Alert and oriented x 3 PSYCHIATRIC:  Normal affect     Signed, Rogue Monetta, MD  04/15/2024 10:42 AM     [facility]

## 2024-04-15 ENCOUNTER — Ambulatory Visit: Attending: Cardiology | Admitting: Cardiology

## 2024-04-15 VITALS — BP 122/78 | HR 88 | Ht 67.0 in | Wt 190.8 lb

## 2024-04-15 DIAGNOSIS — E7841 Elevated Lipoprotein(a): Secondary | ICD-10-CM | POA: Diagnosis not present

## 2024-04-15 DIAGNOSIS — R079 Chest pain, unspecified: Secondary | ICD-10-CM

## 2024-04-15 DIAGNOSIS — R072 Precordial pain: Secondary | ICD-10-CM | POA: Diagnosis not present

## 2024-04-15 MED ORDER — METOPROLOL TARTRATE 100 MG PO TABS: 100.0000 mg | ORAL_TABLET | Freq: Once | ORAL | 0 refills | Status: AC

## 2024-04-15 NOTE — Patient Instructions (Signed)
 Medication Instructions:  Your physician recommends that you continue on your current medications as directed. Please refer to the Current Medication list given to you today.  *If you need a refill on your cardiac medications before your next appointment, please call your pharmacy*  Lab Work: Your physician recommends that you return for lab work in:  Labs today: BMP  If you have labs (blood work) drawn today and your tests are completely normal, you will receive your results only by: MyChart Message (if you have MyChart) OR A paper copy in the mail If you have any lab test that is abnormal or we need to change your treatment, we will call you to review the results.  Testing/Procedures:  Vascuscreen    Your cardiac CT will be scheduled at one of the below locations:   [facility] [address] [place], [place] [address] [phone] (Severe contrast allergies only)  OR   [facility] [address] [place], [place] [address] [phone]  OR   [facility] [address] [place], [place] [address] [phone]  OR   [facility] [address]  [place], [place] [address]  OR   [facility] [address] [place], [place] [phone]  If scheduled at [facility], please arrive at the [facility] and Children's Entrance (Entrance C2) of [facility] 30 minutes prior to test start time. You can use the FREE valet parking offered at entrance C (encouraged to control the heart rate for the test)  Proceed to the [facility] Radiology Department (first floor) to check-in and test prep.  All radiology patients and guests should use entrance C2 at [facility], accessed from [facility], even though the hospital's physical address listed is [address]  If scheduled at the Heart and Vascular Tower at [address], please enter the parking  lot using the [address] entrance and use the FREE valet service at the patient drop-off area. Enter the building and check-in with registration on the main floor.  If scheduled at [facility], please arrive to the Heart and Vascular Center 15 mins early for check-in and test prep.  There is spacious parking and easy access to the radiology department from the [facility] Heart and Vascular entrance. Please enter here and check-in with the desk attendant.   If scheduled at [facility], please arrive 30 minutes early for check-in and test prep.  Please follow these instructions carefully (unless otherwise directed):  An IV will be required for this test and Nitroglycerin will be given.  Hold all erectile dysfunction medications at least 3 days (72 hrs) prior to test. (Ie viagra, cialis, sildenafil, tadalafil, etc)   On the Night Before the Test: Be sure to Drink plenty of water. Do not consume any caffeinated/decaffeinated beverages or chocolate 12 hours prior to your test. Do not take any antihistamines 12 hours prior to your test.  On the Day of the Test: Drink plenty of water until 1 hour prior to the test. Do not eat any food 1 hour prior to test. You may take your regular medications prior to the test.  Take metoprolol (Lopressor) two hours prior to test. Patients who wear a continuous glucose monitor MUST remove the device prior to scanning. FEMALES- please wear underwire-free bra if available, avoid dresses & tight clothing      After the Test: Drink plenty of water.  After receiving IV contrast, you may experience a mild flushed feeling. This is normal. On occasion, you may experience a mild rash up to 24 hours after the test. This is not dangerous. If this occurs, you can take Benadryl  25 mg, Zyrtec, Claritin, or Allegra and increase your fluid intake. (Patients taking Tikosyn should avoid Benadryl , and may take Zyrtec, Claritin, or Allegra) If you  experience trouble breathing, this can be serious. If it is severe call 911 IMMEDIATELY. If it is mild, please call our office.  We will call to schedule your test 2-4 weeks out understanding that some insurance companies will need an authorization prior to the service being performed.   For more information and frequently asked questions, please visit our website : [email or web address]  For non-scheduling related questions, please contact the cardiac imaging nurse navigator should you have any questions/concerns: Cardiac Imaging Nurse Navigators Direct Office Dial: [phone]   For scheduling needs, including cancellations and rescheduling, please call Brittany, [phone].   Follow-Up: At [facility], you and your health needs are our priority.  As part of our continuing mission to provide you with exceptional heart care, our providers are all part of one team.  This team includes your primary Cardiologist (physician) and Advanced Practice Providers or APPs (Physician Assistants and Nurse Practitioners) who all work together to provide you with the care you need, when you need it.  Your next appointment:   3 month(s)  Provider:   Redell Leiter, MD    We recommend signing up for the patient portal called MyChart.  Sign up information is provided on this After Visit Summary.  MyChart is used to connect with patients for Virtual Visits (Telemedicine).  Patients are able to view lab/test results, encounter notes, upcoming appointments, etc.  Non-urgent messages can be sent to your provider as well.   To learn more about what you can do with MyChart, go to [email or web address].   Other Instructions None

## 2024-04-16 ENCOUNTER — Ambulatory Visit: Payer: Self-pay | Admitting: Cardiology

## 2024-04-16 ENCOUNTER — Ambulatory Visit: Attending: Cardiology

## 2024-04-16 ENCOUNTER — Encounter: Payer: Self-pay | Admitting: Cardiology

## 2024-04-16 DIAGNOSIS — R079 Chest pain, unspecified: Secondary | ICD-10-CM

## 2024-04-16 DIAGNOSIS — R072 Precordial pain: Secondary | ICD-10-CM

## 2024-04-16 LAB — BASIC METABOLIC PANEL WITH GFR
BUN/Creatinine Ratio: 16 (ref 12–28)
BUN: 17 mg/dL (ref 8–27)
CO2: 23 mmol/L (ref 20–29)
Calcium: 9.8 mg/dL (ref 8.7–10.3)
Chloride: 99 mmol/L (ref 96–106)
Creatinine, Ser: 1.07 mg/dL — ABNORMAL HIGH (ref 0.57–1.00)
Glucose: 87 mg/dL (ref 70–99)
Potassium: 4.3 mmol/L (ref 3.5–5.2)
Sodium: 143 mmol/L (ref 134–144)
eGFR: 59 mL/min/1.73 — ABNORMAL LOW (ref >59)

## 2024-04-18 DIAGNOSIS — M19072 Primary osteoarthritis, left ankle and foot: Secondary | ICD-10-CM | POA: Diagnosis not present

## 2024-04-18 NOTE — Telephone Encounter (Signed)
 Called [facility] PA department. Spoke with Lakita, certified representative. Arch informed me that a new PA would need to be initiated since there is a change in the prescription. I completed verbal PA request with Lakita. Office notes are required to be faxed for review. Bridgette asked that records be faxed to F: [phone]. There will be a 24-72 hour turn around time for standard PA.   04/02/24 office notes faxed to number provided.  PA# [id number]  [phone].

## 2024-04-18 NOTE — Telephone Encounter (Signed)
 [facility] stating a nurse would have to call the PA line to verify dosage change of Stelara . Phone number is [phone]

## 2024-04-21 NOTE — Telephone Encounter (Signed)
 Pharmacy Patient Advocate Encounter  Prior Authorization form/request asks a question that requires your assistance. Please see the question below and advise accordingly. The PA will not be submitted until the necessary information is received.

## 2024-04-22 ENCOUNTER — Other Ambulatory Visit (HOSPITAL_COMMUNITY): Payer: Self-pay

## 2024-04-22 NOTE — Telephone Encounter (Signed)
 Called and spoke with Shanda at [employer] Confirmed that 04/19/24 approval covers 1 pen every 42 days. Shanda confirmed that they already have prescription on file and she notes a paid claim on 04/19/24.

## 2024-04-28 DIAGNOSIS — M25572 Pain in left ankle and joints of left foot: Secondary | ICD-10-CM | POA: Diagnosis not present

## 2024-04-28 DIAGNOSIS — M25672 Stiffness of left ankle, not elsewhere classified: Secondary | ICD-10-CM | POA: Diagnosis not present

## 2024-04-28 DIAGNOSIS — G8929 Other chronic pain: Secondary | ICD-10-CM | POA: Diagnosis not present

## 2024-05-07 ENCOUNTER — Encounter (HOSPITAL_COMMUNITY): Payer: Self-pay

## 2024-05-08 ENCOUNTER — Inpatient Hospital Stay (INDEPENDENT_AMBULATORY_CARE_PROVIDER_SITE_OTHER): Admission: RE | Admit: 2024-05-08 | Discharge: 2024-05-08 | Attending: Cardiology | Admitting: Cardiology

## 2024-05-08 DIAGNOSIS — R072 Precordial pain: Secondary | ICD-10-CM | POA: Diagnosis not present

## 2024-05-08 MED ORDER — NITROGLYCERIN 0.4 MG SL SUBL
0.8000 mg | SUBLINGUAL_TABLET | Freq: Once | SUBLINGUAL | Status: AC
  Administered 2024-05-08: 0.8 mg via SUBLINGUAL

## 2024-05-08 MED ORDER — IOHEXOL 350 MG/ML SOLN
100.0000 mL | Freq: Once | INTRAVENOUS | Status: AC | PRN
  Administered 2024-05-08: 100 mL via INTRAVENOUS

## 2024-05-09 ENCOUNTER — Encounter: Payer: Self-pay | Admitting: Gastroenterology

## 2024-05-10 ENCOUNTER — Inpatient Hospital Stay (INDEPENDENT_AMBULATORY_CARE_PROVIDER_SITE_OTHER)
Admission: RE | Admit: 2024-05-10 | Discharge: 2024-05-10 | Disposition: A | Payer: Self-pay | Source: Ambulatory Visit | Attending: Cardiology | Admitting: Cardiology

## 2024-05-10 ENCOUNTER — Other Ambulatory Visit: Payer: Self-pay | Admitting: Cardiology

## 2024-05-10 DIAGNOSIS — R931 Abnormal findings on diagnostic imaging of heart and coronary circulation: Secondary | ICD-10-CM

## 2024-05-10 NOTE — Progress Notes (Signed)
 FFR Order

## 2024-05-12 ENCOUNTER — Other Ambulatory Visit: Payer: Self-pay

## 2024-05-12 ENCOUNTER — Ambulatory Visit: Payer: Self-pay | Admitting: Cardiology

## 2024-05-12 MED ORDER — REPATHA SURECLICK 140 MG/ML ~~LOC~~ SOAJ: 140.0000 mg | SUBCUTANEOUS | 12 refills | Status: AC

## 2024-05-13 ENCOUNTER — Telehealth: Payer: Self-pay

## 2024-05-13 DIAGNOSIS — Z124 Encounter for screening for malignant neoplasm of cervix: Secondary | ICD-10-CM | POA: Diagnosis not present

## 2024-05-13 DIAGNOSIS — Z1151 Encounter for screening for human papillomavirus (HPV): Secondary | ICD-10-CM | POA: Diagnosis not present

## 2024-05-13 DIAGNOSIS — R399 Unspecified symptoms and signs involving the genitourinary system: Secondary | ICD-10-CM | POA: Diagnosis not present

## 2024-05-13 NOTE — Telephone Encounter (Signed)
Left message on My Chart with CT results per Dr. Vanetta Shawl note. Routed to PCP.

## 2024-05-14 ENCOUNTER — Encounter: Payer: Self-pay | Admitting: Cardiology

## 2024-05-15 ENCOUNTER — Telehealth: Payer: Self-pay | Admitting: Gastroenterology

## 2024-05-15 NOTE — Telephone Encounter (Signed)
 New instructions sent to pt via MyChart. Sent her a message to make her aware.

## 2024-05-15 NOTE — Telephone Encounter (Signed)
 Good morning Dr. San,   I received a call from this patient stating that she has not been able to keep anything done. Patient was scheduled to have a colonoscopy December the 12 th. Patient was rescheduled for January the 22 nd. Please advise.    Thank you.

## 2024-05-16 ENCOUNTER — Encounter: Admitting: Gastroenterology

## 2024-05-19 ENCOUNTER — Telehealth: Payer: Self-pay | Admitting: Cardiology

## 2024-05-19 DIAGNOSIS — L821 Other seborrheic keratosis: Secondary | ICD-10-CM | POA: Diagnosis not present

## 2024-05-19 NOTE — Telephone Encounter (Signed)
 Pt c/o medication issue:  1. Name of Medication: Evolocumab  (REPATHA  SURECLICK) 140 MG/ML SOAJ   2. How are you currently taking this medication (dosage and times per day)? N/A  3. Are you having a reaction (difficulty breathing--STAT)? No  4. What is your medication issue? Pt requesting prior auth for this medication

## 2024-05-19 NOTE — Telephone Encounter (Signed)
-----   Message from Lamar Fitch, MD sent at 05/12/2024  9:36 AM EST ----- Coronary CTA showed low likelihood of obstructive disease ----- Message ----- From: Interface, Rad Results In Sent: 05/10/2024   5:04 PM EST To: Lamar JINNY Fitch, MD

## 2024-05-19 NOTE — Telephone Encounter (Signed)
 Left vm to return call.

## 2024-05-20 ENCOUNTER — Ambulatory Visit (HOSPITAL_COMMUNITY): Admission: RE | Admit: 2024-05-20 | Source: Ambulatory Visit | Attending: Cardiology | Admitting: Cardiology

## 2024-05-22 DIAGNOSIS — Z79899 Other long term (current) drug therapy: Secondary | ICD-10-CM | POA: Diagnosis not present

## 2024-05-22 DIAGNOSIS — E785 Hyperlipidemia, unspecified: Secondary | ICD-10-CM | POA: Diagnosis not present

## 2024-05-22 DIAGNOSIS — E890 Postprocedural hypothyroidism: Secondary | ICD-10-CM | POA: Diagnosis not present

## 2024-05-22 DIAGNOSIS — M0760 Enteropathic arthropathies, unspecified site: Secondary | ICD-10-CM | POA: Diagnosis not present

## 2024-05-22 DIAGNOSIS — I10 Essential (primary) hypertension: Secondary | ICD-10-CM | POA: Diagnosis not present

## 2024-05-22 DIAGNOSIS — F419 Anxiety disorder, unspecified: Secondary | ICD-10-CM | POA: Diagnosis not present

## 2024-05-22 DIAGNOSIS — Z6830 Body mass index (BMI) 30.0-30.9, adult: Secondary | ICD-10-CM | POA: Diagnosis not present

## 2024-05-22 DIAGNOSIS — K51918 Ulcerative colitis, unspecified with other complication: Secondary | ICD-10-CM | POA: Diagnosis not present

## 2024-05-22 DIAGNOSIS — R41840 Attention and concentration deficit: Secondary | ICD-10-CM | POA: Diagnosis not present

## 2024-05-22 DIAGNOSIS — I2510 Atherosclerotic heart disease of native coronary artery without angina pectoris: Secondary | ICD-10-CM | POA: Diagnosis not present

## 2024-05-27 DIAGNOSIS — G8929 Other chronic pain: Secondary | ICD-10-CM | POA: Diagnosis not present

## 2024-05-27 DIAGNOSIS — M25672 Stiffness of left ankle, not elsewhere classified: Secondary | ICD-10-CM | POA: Diagnosis not present

## 2024-05-27 DIAGNOSIS — M25572 Pain in left ankle and joints of left foot: Secondary | ICD-10-CM | POA: Diagnosis not present

## 2024-06-13 ENCOUNTER — Encounter: Payer: Self-pay | Admitting: Family

## 2024-06-13 ENCOUNTER — Telehealth: Payer: Self-pay | Admitting: Gastroenterology

## 2024-06-13 NOTE — Telephone Encounter (Signed)
 Returned call to patient & advised her PA team will be working on this. Also, discussed the form we received from [facility] requesting for us  to fill out an adverse reaction form d/t symptoms reported. She stated she had spoken with a rep previously & cleared this up. No reaction to the medication, there was an error on their end and we can disregard. We also rescheduled her colon d/t insurance reasons to 2/18 at 8:30 am with Dr. San & OV for 3/10 at 2:00 pm. Updated instructions sent to pt. She had no further questions at end of call.

## 2024-06-13 NOTE — Telephone Encounter (Signed)
 Call from pt requesting prior auth for ustekinumab  (STELARA )  [phone] [facility] provider portal.

## 2024-06-17 ENCOUNTER — Other Ambulatory Visit (HOSPITAL_COMMUNITY): Payer: Self-pay

## 2024-06-17 ENCOUNTER — Encounter: Payer: Self-pay | Admitting: Family

## 2024-06-17 ENCOUNTER — Telehealth: Payer: Self-pay

## 2024-06-17 NOTE — Telephone Encounter (Signed)
 PA request has been Submitted. New Encounter has been or will be created for follow up. For additional info see Pharmacy Prior Auth telephone encounter from 06-17-2024.

## 2024-06-17 NOTE — Telephone Encounter (Signed)
 Noted

## 2024-06-17 NOTE — Telephone Encounter (Signed)
 Pharmacy Patient Advocate Encounter   Received notification from Pt Calls Messages that prior authorization for Stelara  90MG /ML syringes is required/requested.   Insurance verification completed.   The patient is insured through [facility].   Per test claim: PA required; PA submitted to above mentioned insurance via Latent Key/confirmation #/EOC [id number] Status is pending

## 2024-06-18 ENCOUNTER — Other Ambulatory Visit (HOSPITAL_COMMUNITY): Payer: Self-pay

## 2024-06-18 NOTE — Telephone Encounter (Signed)
 Pharmacy Patient Advocate Encounter  Received notification from [facility] that Prior Authorization for Stelara  90MG /ML syringes has been APPROVED from 06-17-2024 to 06-17-2025   PA #/Case ID/Reference #: [id number]

## 2024-06-24 ENCOUNTER — Other Ambulatory Visit: Payer: Self-pay

## 2024-06-24 ENCOUNTER — Telehealth: Payer: Self-pay

## 2024-06-24 DIAGNOSIS — K50119 Crohn's disease of large intestine with unspecified complications: Secondary | ICD-10-CM

## 2024-06-24 MED ORDER — USTEKINUMAB 90 MG/ML ~~LOC~~ SOSY
90.0000 mg | PREFILLED_SYRINGE | SUBCUTANEOUS | 7 refills | Status: AC
  Filled 2024-06-27 (×2): qty 1, 42d supply, fill #0

## 2024-06-24 NOTE — Telephone Encounter (Signed)
 Received a fax from [facility] with notification of specialty pharmacy assignment requesting that Stelara  syringe prescription be sent to [facility].  Prescription was sent to pharmacy. Left message for pt to call back

## 2024-06-25 NOTE — Telephone Encounter (Signed)
 Inbound call from patient stating that she is returning a call back to steven. Patient is requesting a call back. Please advise.

## 2024-06-26 ENCOUNTER — Other Ambulatory Visit: Payer: Self-pay

## 2024-06-26 ENCOUNTER — Encounter: Admitting: Gastroenterology

## 2024-06-26 ENCOUNTER — Other Ambulatory Visit (HOSPITAL_COMMUNITY): Payer: Self-pay

## 2024-06-26 NOTE — Telephone Encounter (Signed)
 Spoke with patient & made her aware about the prescription being changed to [facility]. There number has been provided to pt.

## 2024-06-26 NOTE — Telephone Encounter (Signed)
PT is returning call. Please advise.  

## 2024-06-27 ENCOUNTER — Other Ambulatory Visit: Payer: Self-pay

## 2024-06-27 ENCOUNTER — Other Ambulatory Visit (HOSPITAL_COMMUNITY): Payer: Self-pay

## 2024-06-27 ENCOUNTER — Encounter: Payer: Self-pay | Admitting: Family

## 2024-06-27 NOTE — Progress Notes (Signed)
 Specialty Pharmacy Initial Fill Coordination Note  Kristina Gentry is a 64 y.o. female contacted today regarding initial fill of specialty medication(s) Ustekinumab  (STELARA )   Patient requested Marylyn at [facility] [place] date: 06/27/24   Medication will be filled on: 06/27/24    Patient is aware of $8,500.00 copayment.

## 2024-06-27 NOTE — Progress Notes (Signed)
 Specialty Pharmacy Initiation Note   Kristina Gentry is a 63 y.o. female who will be followed by the specialty pharmacy service for RxSp Crohn's Disease    Review of administration, indication, effectiveness, safety, potential side effects, storage/disposable, and missed dose instructions occurred today for patient's specialty medication(s) Ustekinumab  (STELARA )     Patient/Caregiver did not have any additional questions or concerns.   Patient's therapy is appropriate to: Continue (Patient well established on therapy for many years, transferring pharmacies due to insurance)    Goals Addressed             This Visit's Progress    Minimize recurrence of flares       Patient is on track. Patient will maintain adherence. Patient well established on therapy for many years, transferring pharmacies due to insurance          [name] Specialty Pharmacist

## 2024-06-30 ENCOUNTER — Other Ambulatory Visit: Payer: Self-pay

## 2024-07-02 ENCOUNTER — Other Ambulatory Visit (HOSPITAL_COMMUNITY): Payer: Self-pay

## 2024-07-02 ENCOUNTER — Telehealth: Payer: Self-pay | Admitting: Pharmacy Technician

## 2024-07-02 ENCOUNTER — Encounter: Payer: Self-pay | Admitting: Family

## 2024-07-02 NOTE — Telephone Encounter (Signed)
 PA request has been Submitted. New Encounter has been or will be created for follow up. For additional info see Pharmacy Prior Auth telephone encounter from 07/02/24.

## 2024-07-02 NOTE — Telephone Encounter (Signed)
 Pt got a new insurance, [employer]. Please advise if she needs new PA for Repatha .

## 2024-07-02 NOTE — Telephone Encounter (Signed)
 Pharmacy Patient Advocate Encounter   Received notification from Pt Calls Messages that prior authorization for repatha  is required/requested.   Insurance verification completed.   The patient is insured through [facility].   Per test claim: PA required; PA submitted to above mentioned insurance via Latent Key/confirmation #/EOC [id number] Status is pending

## 2024-07-03 ENCOUNTER — Other Ambulatory Visit (HOSPITAL_COMMUNITY): Payer: Self-pay

## 2024-07-03 ENCOUNTER — Other Ambulatory Visit: Payer: Self-pay

## 2024-07-04 NOTE — Telephone Encounter (Signed)
 Pharmacy Patient Advocate Encounter  Received notification from [facility] that Prior Authorization for repatha  has been APPROVED from 07/04/24 to 07/02/27   PA #/Case ID/Reference #: [id number]

## 2024-07-10 ENCOUNTER — Ambulatory Visit: Payer: Self-pay | Admitting: Cardiology

## 2024-07-23 ENCOUNTER — Encounter: Admitting: Gastroenterology

## 2024-08-12 ENCOUNTER — Ambulatory Visit: Admitting: Gastroenterology

## 2024-08-20 ENCOUNTER — Ambulatory Visit: Admitting: Cardiology
